# Patient Record
Sex: Female | Born: 1960 | Race: White | Hispanic: Yes | Marital: Married | Smoking: Never smoker
Health system: Southern US, Community
[De-identification: ages and names within clinical notes are randomized; demographics above are authoritative.]

## PROBLEM LIST (undated history)

## (undated) DIAGNOSIS — I10 Essential (primary) hypertension: Secondary | ICD-10-CM

## (undated) DIAGNOSIS — Z780 Asymptomatic menopausal state: Secondary | ICD-10-CM

## (undated) HISTORY — DX: Asymptomatic menopausal state: Z78.0

## (undated) HISTORY — DX: Essential (primary) hypertension: I10

---

## 2000-12-15 ENCOUNTER — Other Ambulatory Visit: Admission: RE | Admit: 2000-12-15 | Discharge: 2000-12-15 | Payer: Self-pay | Admitting: Obstetrics and Gynecology

## 2000-12-18 ENCOUNTER — Encounter: Payer: Self-pay | Admitting: Obstetrics and Gynecology

## 2000-12-18 ENCOUNTER — Ambulatory Visit (HOSPITAL_COMMUNITY): Admission: RE | Admit: 2000-12-18 | Discharge: 2000-12-18 | Payer: Self-pay | Admitting: Obstetrics and Gynecology

## 2003-06-15 ENCOUNTER — Ambulatory Visit (HOSPITAL_COMMUNITY): Admission: RE | Admit: 2003-06-15 | Discharge: 2003-06-15 | Payer: Self-pay | Admitting: Obstetrics and Gynecology

## 2004-08-22 ENCOUNTER — Ambulatory Visit (HOSPITAL_COMMUNITY): Admission: RE | Admit: 2004-08-22 | Discharge: 2004-08-22 | Payer: Self-pay | Admitting: Obstetrics and Gynecology

## 2005-08-26 ENCOUNTER — Ambulatory Visit (HOSPITAL_COMMUNITY): Admission: RE | Admit: 2005-08-26 | Discharge: 2005-08-26 | Payer: Self-pay | Admitting: Obstetrics and Gynecology

## 2006-08-29 ENCOUNTER — Ambulatory Visit (HOSPITAL_COMMUNITY): Admission: RE | Admit: 2006-08-29 | Discharge: 2006-08-29 | Payer: Self-pay | Admitting: Obstetrics and Gynecology

## 2007-09-03 ENCOUNTER — Ambulatory Visit (HOSPITAL_COMMUNITY): Admission: RE | Admit: 2007-09-03 | Discharge: 2007-09-03 | Payer: Self-pay | Admitting: Obstetrics and Gynecology

## 2007-09-23 ENCOUNTER — Other Ambulatory Visit: Admission: RE | Admit: 2007-09-23 | Discharge: 2007-09-23 | Payer: Self-pay | Admitting: Obstetrics and Gynecology

## 2008-09-07 ENCOUNTER — Ambulatory Visit (HOSPITAL_COMMUNITY): Admission: RE | Admit: 2008-09-07 | Discharge: 2008-09-07 | Payer: Self-pay | Admitting: Obstetrics and Gynecology

## 2008-10-20 ENCOUNTER — Other Ambulatory Visit: Admission: RE | Admit: 2008-10-20 | Discharge: 2008-10-20 | Payer: Self-pay | Admitting: Obstetrics and Gynecology

## 2009-09-08 ENCOUNTER — Ambulatory Visit (HOSPITAL_COMMUNITY): Admission: RE | Admit: 2009-09-08 | Discharge: 2009-09-08 | Payer: Self-pay | Admitting: Obstetrics and Gynecology

## 2009-10-23 ENCOUNTER — Other Ambulatory Visit: Admission: RE | Admit: 2009-10-23 | Discharge: 2009-10-23 | Payer: Self-pay | Admitting: Obstetrics and Gynecology

## 2010-08-29 ENCOUNTER — Other Ambulatory Visit: Payer: Self-pay | Admitting: Obstetrics and Gynecology

## 2010-08-29 DIAGNOSIS — Z139 Encounter for screening, unspecified: Secondary | ICD-10-CM

## 2010-09-13 ENCOUNTER — Ambulatory Visit (HOSPITAL_COMMUNITY)
Admission: RE | Admit: 2010-09-13 | Discharge: 2010-09-13 | Disposition: A | Discharge status: Home / Self Care | Payer: BC Managed Care – PPO | Source: Ambulatory Visit | Attending: Obstetrics and Gynecology | Admitting: Obstetrics and Gynecology

## 2010-09-13 DIAGNOSIS — Z139 Encounter for screening, unspecified: Secondary | ICD-10-CM

## 2010-09-13 DIAGNOSIS — Z1231 Encounter for screening mammogram for malignant neoplasm of breast: Secondary | ICD-10-CM | POA: Insufficient documentation

## 2010-09-18 ENCOUNTER — Other Ambulatory Visit: Payer: Self-pay | Admitting: Obstetrics and Gynecology

## 2010-09-18 DIAGNOSIS — R928 Other abnormal and inconclusive findings on diagnostic imaging of breast: Secondary | ICD-10-CM

## 2010-10-03 ENCOUNTER — Ambulatory Visit (HOSPITAL_COMMUNITY)
Admission: RE | Admit: 2010-10-03 | Discharge: 2010-10-03 | Disposition: A | Discharge status: Home / Self Care | Payer: BC Managed Care – PPO | Source: Ambulatory Visit | Attending: Obstetrics and Gynecology | Admitting: Obstetrics and Gynecology

## 2010-10-03 ENCOUNTER — Other Ambulatory Visit (HOSPITAL_COMMUNITY): Payer: Self-pay | Admitting: Obstetrics and Gynecology

## 2010-10-03 DIAGNOSIS — R928 Other abnormal and inconclusive findings on diagnostic imaging of breast: Secondary | ICD-10-CM

## 2010-10-03 DIAGNOSIS — N6009 Solitary cyst of unspecified breast: Secondary | ICD-10-CM | POA: Insufficient documentation

## 2010-11-12 ENCOUNTER — Other Ambulatory Visit: Payer: Self-pay | Admitting: Adult Health

## 2010-11-12 ENCOUNTER — Other Ambulatory Visit (HOSPITAL_COMMUNITY)
Admission: RE | Admit: 2010-11-12 | Discharge: 2010-11-12 | Disposition: A | Discharge status: Home / Self Care | Payer: BC Managed Care – PPO | Source: Ambulatory Visit | Attending: Obstetrics and Gynecology | Admitting: Obstetrics and Gynecology

## 2010-11-12 DIAGNOSIS — Z01419 Encounter for gynecological examination (general) (routine) without abnormal findings: Secondary | ICD-10-CM | POA: Insufficient documentation

## 2011-09-25 ENCOUNTER — Other Ambulatory Visit: Payer: Self-pay | Admitting: Adult Health

## 2011-09-25 DIAGNOSIS — Z139 Encounter for screening, unspecified: Secondary | ICD-10-CM

## 2011-10-07 ENCOUNTER — Ambulatory Visit (HOSPITAL_COMMUNITY)
Admission: RE | Admit: 2011-10-07 | Discharge: 2011-10-07 | Disposition: A | Discharge status: Home / Self Care | Payer: BC Managed Care – PPO | Source: Ambulatory Visit | Attending: Adult Health | Admitting: Adult Health

## 2011-10-07 DIAGNOSIS — Z1231 Encounter for screening mammogram for malignant neoplasm of breast: Secondary | ICD-10-CM | POA: Insufficient documentation

## 2011-10-07 DIAGNOSIS — Z139 Encounter for screening, unspecified: Secondary | ICD-10-CM

## 2011-11-14 ENCOUNTER — Other Ambulatory Visit (HOSPITAL_COMMUNITY)
Admission: RE | Admit: 2011-11-14 | Discharge: 2011-11-14 | Disposition: A | Discharge status: Home / Self Care | Payer: BC Managed Care – PPO | Source: Ambulatory Visit | Attending: Obstetrics and Gynecology | Admitting: Obstetrics and Gynecology

## 2011-11-14 ENCOUNTER — Other Ambulatory Visit: Payer: Self-pay | Admitting: Adult Health

## 2011-11-14 DIAGNOSIS — Z1151 Encounter for screening for human papillomavirus (HPV): Secondary | ICD-10-CM | POA: Insufficient documentation

## 2011-11-14 DIAGNOSIS — Z01419 Encounter for gynecological examination (general) (routine) without abnormal findings: Secondary | ICD-10-CM | POA: Insufficient documentation

## 2012-08-25 ENCOUNTER — Other Ambulatory Visit: Payer: Self-pay | Admitting: Adult Health

## 2012-08-25 DIAGNOSIS — Z139 Encounter for screening, unspecified: Secondary | ICD-10-CM

## 2012-10-08 ENCOUNTER — Ambulatory Visit (HOSPITAL_COMMUNITY)
Admission: RE | Admit: 2012-10-08 | Discharge: 2012-10-08 | Disposition: A | Discharge status: Home / Self Care | Payer: BC Managed Care – PPO | Source: Ambulatory Visit | Attending: Adult Health | Admitting: Adult Health

## 2012-10-08 DIAGNOSIS — Z1231 Encounter for screening mammogram for malignant neoplasm of breast: Secondary | ICD-10-CM | POA: Insufficient documentation

## 2012-10-08 DIAGNOSIS — Z139 Encounter for screening, unspecified: Secondary | ICD-10-CM

## 2012-11-18 ENCOUNTER — Encounter: Payer: Self-pay | Admitting: Adult Health

## 2012-11-18 ENCOUNTER — Ambulatory Visit (INDEPENDENT_AMBULATORY_CARE_PROVIDER_SITE_OTHER): Payer: BC Managed Care – PPO | Admitting: Adult Health

## 2012-11-18 VITALS — BP 140/88 | HR 78 | Ht 64.0 in | Wt 180.0 lb

## 2012-11-18 DIAGNOSIS — I10 Essential (primary) hypertension: Secondary | ICD-10-CM | POA: Insufficient documentation

## 2012-11-18 DIAGNOSIS — Z01419 Encounter for gynecological examination (general) (routine) without abnormal findings: Secondary | ICD-10-CM

## 2012-11-18 DIAGNOSIS — Z309 Encounter for contraceptive management, unspecified: Secondary | ICD-10-CM

## 2012-11-18 DIAGNOSIS — Z1212 Encounter for screening for malignant neoplasm of rectum: Secondary | ICD-10-CM

## 2012-11-18 LAB — HEMOCCULT GUIAC POC 1CARD (OFFICE)

## 2012-11-18 MED ORDER — HYDROCHLOROTHIAZIDE 12.5 MG PO CAPS: 12.5000 mg | ORAL_CAPSULE | Freq: Every day | ORAL | Status: DC

## 2012-11-18 NOTE — Progress Notes (Signed)
Patient ID: Kristen Hunt, female   DOB: 02-Jul-1960, 52 y.o.   MRN: [id number] History of Present Illness: Kristen Hunt is a 52 year old Hispanic female married in for her physical. She had normal pap and negative HPV in 2013.  Current Medications, Allergies, Past Medical History, Past Surgical History, Family History and Social History were reviewed in [employer] record.     Review of Systems: Patient denies any daily headaches, blurred vision, shortness of breath, chest pain, abdominal pain, problems with bowel movements, urination, or intercourse. Has menstrual headaches some, no joint pain or mood changes, has some sleep problems, no hot flashes yet.Has noticed weight gain.    Physical Exam:BP 140/88  Pulse 78  Ht 5\' 4"  (1.626 m)  Wt 180 lb (81.647 kg)  BMI 30.88 kg/m2  LMP 08/21/2014BP at home 118/78 General:  Well developed, well nourished, no acute distress Skin:  Warm and dry Neck:  Midline trachea, normal thyroid Lungs; Clear to auscultation bilaterally Breast:  No dominant palpable mass, retraction, or nipple discharge Cardiovascular: Regular rate and rhythm Abdomen:  Soft, non tender, no hepatosplenomegaly Pelvic:  External genitalia is normal in appearance.  The vagina is normal in appearance. The cervix is bulbous.  Uterus is felt to be normal size, shape, and contour.  No  adnexal masses or tenderness noted. Rectal: Good sphincter tone, no polyps, or hemorrhoids felt.  Hemoccult negative. Extremities:  No swelling or varicosities noted Psych:  Alert and cooperative seems happy   Impression: Yearly gyn exam no pap Hypertension Contraceptive management     Plan: Finish this pack of pills, camrese, and stay off x 1 month then check FSH Refilled Microzide Physical in 1 year Mammogram yearly Colonoscopy advised Will check fasting labs at [facility] check

## 2012-11-18 NOTE — Patient Instructions (Addendum)
Physical in 1 year Mammogram yearly Labs near future call when off pills x 1 month for labs Colonoscopy advised Menopausia (Menopause) La menopausia es el momento normal de la vida en que los perodos menstruales cesan completamente. Se considera definitiva cuando no ha habido perodos durante 12 meses consecutivos. Generalmente ocurre [employer] [address] y los [address], y el promedio son los 51 aos. En muy raras ocasiones se produce antes de los 40 aos. En [employer], los ovarios dejan de producir hormonas femeninas, estrgenos y Education officer, museum. Esto puede causar sntomas indeseables y Engineer, maintenance. En algunos casos los sntomas pueden ocurrir entre 4 a 5 aos antes del comienzo de la menopausia. No hay relacin entre el uso de anticonceptivos orales, el nmero de hijos que ha tenido, la raza o la edad en que los perodos menstruales comenzaron [place]). Las mujeres muy fumadoras y las muy delgadas pueden desarrollarla precozmente. CAUSAS  Los ovarios dejan de producir hormonas femeninas, estrgenos y Education officer, museum.  Otras causas son:  [place] en la que se extirpan ambos ovarios.  Los ovarios dejan de funcionar por causa desconocida.  Tumores en la glndula pituitaria, ubicada en el cerebro.  Enfermedades que [employer] ovarios y la produccin de hormonas.  Tratamiento de radioterapia en el abdomen o en la pelvis.  Quimioterapia que [employer]. SNTOMAS  Sofocos.  Sudoracin nocturna.  Disminucin del impulso sexual.  Sequedad vaginal y disminucin del tamao de los rganos genitales, lo que causa relaciones sexuales dolorosas.  Sequedad de la piel y aparicin de [place].  [place]efaleas.  Cansancio.  Irritabilidad.  Problemas de memoria.  Aumento de [place].  Infecciones urinarias.  Crecimiento del vello en el rostro y [place].  Infertilidad. Otros sintomas ms graves son:  Prdida de masa sea (osteoporosis), lo que causa fracturas de  huesos.  Depresin.  Endurecimiento y Scientist, research (medical) de las arterias (aterosclerosis), lo que puede ocasionar infartos e ictus. DIAGNSTICO  Cuando el perodo menstrual falta durante 12 meses corridos.  Exmenes fsicos  Estudios hormonales de [place]. TRATAMIENTO Hay muchas opciones de tratamiento y casi tantas preguntas como opciones existen. La decisin de tratar o no los cambios que trae la menopausia es una decisin que realiza el profesional de acuerdo con cada Marketing executive. El mdico comentar el tratamiento con usted. Juntos pueden decidir que tratamiento ser el mejor, por ejemplo:  Tratamiento de reemplazo hormonal.  Tratamiento de los sntomas individuales con medicamentos (por ejemplo tranquilizantes para la depresin).  Hierbas que pueden ayudar en algunos sntomas especficos.  Psicoterapia con un psiquiatra o un psiclogo.  Terapia grupal.  No recibir tratamiento. INSTRUCCIONES PARA EL CUIDADO DOMICILIARIO  Tome los medicamentos segn las indicaciones.  Descanse y duerma lo suficiente.  Practicar ejercicios con regularidad.  Consuma una dieta rica en calcio (buena para los [place]) y soja (acta como un estrgeno).  Evite las bebidas alcohlicas.  No fume.  El consumo de vitamina E puede ayudar en ciertos casos.  Si tiene sofocos, vstase en capas.  Tome suplementos de calcio y vitamina D para fortalecer los [place].  Puede usar cremas de venta libre para la sequedad vaginal.  En algunos casos es de gran ayuda la terapia grupal.  Tambin puede ser de utilidad la acupuntura. SOLICITE ANTENCIN MDICA SI:  No est segura de estar cursando la menopausia.  Tiene los sntomas y necesita consejo y [place].  Tiene perodos menstruales despus de los 55 aos.  Tiene dolor durante las [employer].  Est en la menopausia (no  ha tenido perodos menstruales durante 12 meses) y desarrolla una hemorragia vaginal.  Necesita ser derivada  a un especialista (gineclogo, psiquiatra o psiclogo) para Pensions consultant. SOLICITE ATENCIN MDICA DE INMEDIATO SI:  Sufre una depresin severa.  Tiene una hemorragia vaginal abundante.  Se ha cado y piensa que se ha roto un hueso.  Siente dolor al [employer].  Siente dolor en el pecho o en la pierna.  Siente latidos cardacos acelerados (palpitaciones)  Tiene dolores de cabeza intensos.  Desarrolla trastornos visuales.  Siente un bulto en el pecho.  Tiene dolor abdominal, o sufre una indigestin grave. Document Released: 04/01/2006 Document Revised: 05/13/2011 [facility] Patient Information 2014 [place], [place].

## 2013-04-29 ENCOUNTER — Other Ambulatory Visit: Payer: BC Managed Care – PPO

## 2013-05-03 ENCOUNTER — Other Ambulatory Visit: Payer: BC Managed Care – PPO

## 2013-05-03 ENCOUNTER — Encounter (INDEPENDENT_AMBULATORY_CARE_PROVIDER_SITE_OTHER): Payer: Self-pay

## 2013-05-03 DIAGNOSIS — Z1329 Encounter for screening for other suspected endocrine disorder: Secondary | ICD-10-CM

## 2013-05-03 DIAGNOSIS — Z0000 Encounter for general adult medical examination without abnormal findings: Secondary | ICD-10-CM

## 2013-05-03 DIAGNOSIS — Z13220 Encounter for screening for lipoid disorders: Secondary | ICD-10-CM

## 2013-05-03 LAB — COMPREHENSIVE METABOLIC PANEL
ALT: 54 U/L — ABNORMAL HIGH (ref 0–35)
AST: 33 U/L (ref 0–37)
Albumin: 4 g/dL (ref 3.5–5.2)
Alkaline Phosphatase: 105 U/L (ref 39–117)
BUN: 12 mg/dL (ref 6–23)
CALCIUM: 9.3 mg/dL (ref 8.4–10.5)
CHLORIDE: 102 meq/L (ref 96–112)
CO2: 30 mEq/L (ref 19–32)
CREATININE: 0.79 mg/dL (ref 0.50–1.10)
Glucose, Bld: 95 mg/dL (ref 70–99)
Potassium: 4.5 mEq/L (ref 3.5–5.3)
Sodium: 139 mEq/L (ref 135–145)
Total Bilirubin: 0.7 mg/dL (ref 0.2–1.2)
Total Protein: 6.6 g/dL (ref 6.0–8.3)

## 2013-05-03 LAB — LIPID PANEL
CHOL/HDL RATIO: 3.3 ratio
Cholesterol: 186 mg/dL (ref 0–200)
HDL: 56 mg/dL (ref >39)
LDL Cholesterol: 112 mg/dL — ABNORMAL HIGH (ref 0–99)
TRIGLYCERIDES: 91 mg/dL (ref <150)
VLDL: 18 mg/dL (ref 0–40)

## 2013-05-03 LAB — CBC
HEMATOCRIT: 40.6 % (ref 36.0–46.0)
Hemoglobin: 13.7 g/dL (ref 12.0–15.0)
MCH: 28.8 pg (ref 26.0–34.0)
MCHC: 33.7 g/dL (ref 30.0–36.0)
MCV: 85.5 fL (ref 78.0–100.0)
Platelets: 322 10*3/uL (ref 150–400)
RBC: 4.75 MIL/uL (ref 3.87–5.11)
RDW: 14.6 % (ref 11.5–15.5)
WBC: 7.2 10*3/uL (ref 4.0–10.5)

## 2013-05-03 LAB — TSH: TSH: 1.322 u[IU]/mL (ref 0.350–4.500)

## 2013-05-04 LAB — FOLLICLE STIMULATING HORMONE: FSH: 98 m[IU]/mL

## 2013-05-05 ENCOUNTER — Telehealth: Payer: Self-pay | Admitting: Obstetrics and Gynecology

## 2013-05-05 NOTE — Telephone Encounter (Signed)
Pt aware of labs, FSH 98

## 2013-06-30 ENCOUNTER — Other Ambulatory Visit: Payer: Self-pay | Admitting: Obstetrics and Gynecology

## 2013-09-22 ENCOUNTER — Other Ambulatory Visit: Payer: Self-pay | Admitting: Obstetrics and Gynecology

## 2013-09-22 DIAGNOSIS — Z1231 Encounter for screening mammogram for malignant neoplasm of breast: Secondary | ICD-10-CM

## 2013-10-11 ENCOUNTER — Ambulatory Visit (HOSPITAL_COMMUNITY)
Admission: RE | Admit: 2013-10-11 | Discharge: 2013-10-11 | Disposition: A | Discharge status: Home / Self Care | Payer: BC Managed Care – PPO | Source: Ambulatory Visit | Attending: Obstetrics and Gynecology | Admitting: Obstetrics and Gynecology

## 2013-10-11 DIAGNOSIS — Z1231 Encounter for screening mammogram for malignant neoplasm of breast: Secondary | ICD-10-CM

## 2013-11-19 ENCOUNTER — Encounter: Payer: Self-pay | Admitting: Adult Health

## 2013-11-19 ENCOUNTER — Ambulatory Visit (INDEPENDENT_AMBULATORY_CARE_PROVIDER_SITE_OTHER): Payer: BC Managed Care – PPO | Admitting: Adult Health

## 2013-11-19 VITALS — BP 130/84 | HR 74 | Ht 63.25 in | Wt 178.5 lb

## 2013-11-19 DIAGNOSIS — Z780 Asymptomatic menopausal state: Secondary | ICD-10-CM

## 2013-11-19 DIAGNOSIS — Z01419 Encounter for gynecological examination (general) (routine) without abnormal findings: Secondary | ICD-10-CM

## 2013-11-19 DIAGNOSIS — I10 Essential (primary) hypertension: Secondary | ICD-10-CM

## 2013-11-19 DIAGNOSIS — Z1212 Encounter for screening for malignant neoplasm of rectum: Secondary | ICD-10-CM

## 2013-11-19 HISTORY — DX: Asymptomatic menopausal state: Z78.0

## 2013-11-19 LAB — HEMOCCULT GUIAC POC 1CARD (OFFICE): Fecal Occult Blood, POC: NEGATIVE

## 2013-11-19 MED ORDER — HYDROCHLOROTHIAZIDE 12.5 MG PO CAPS: ORAL_CAPSULE | ORAL | Status: DC

## 2013-11-19 NOTE — Progress Notes (Signed)
Patient ID: Kristen Hunt, female   DOB: December 31, 1960, 53 y.o.   MRN: [id number] History of Present Illness: Kristen Hunt is a 53 year old Hispanic female, in for a gyn physical.She had a normal pap with negative HPV 11/14/11.No complaints.   Current Medications, Allergies, Past Medical History, Past Surgical History, Family History and Social History were reviewed in [employer] record.     Review of Systems: Patient denies any headaches, blurred vision, shortness of breath, chest pain, abdominal pain, problems with bowel movements, urination, or intercourse. No joint pain or mood swings.    Physical Exam:BP 130/84  Pulse 74  Ht 5' 3.25" (1.607 m)  Wt 178 lb 8 oz (80.967 kg)  BMI 31.35 kg/m2  LMP 10/22/2012 General:  Well developed, well nourished, no acute distress Skin:  Warm and dry Neck:  Midline trachea, normal thyroid Lungs; Clear to auscultation bilaterally Breast:  No dominant palpable mass, retraction, or nipple discharge Cardiovascular: Regular rate and rhythm Abdomen:  Soft, non tender, no hepatosplenomegaly Pelvic:  External genitalia is normal in appearance.  The vagina is normal in appearance.  The cervix is bulbous.  Uterus is felt to be normal size, shape, and contour.  No adnexal masses or tenderness noted. Rectal: Good sphincter tone, no polyps, or hemorrhoids felt.  Hemoccult negative. Extremities:  No swelling or varicosities noted Psych:  No mood changes, alert and cooperative,seems happy, is working 4 miles daily and is trying to cut down on food. Had labs earlier this year.   Impression: Yearly gyn exam no pap Hypertension  Postmenopausal    Plan: Refilled Microzide 12.5 mg #90 with 4 refills, take 1 daily Physical and pap in 1 year Mammogram yearly Colonoscopy advised

## 2013-11-19 NOTE — Patient Instructions (Signed)
Physical in 1 year Mammogram yearly  Colonoscopy advised  

## 2014-01-03 ENCOUNTER — Encounter: Payer: Self-pay | Admitting: Adult Health

## 2014-09-21 ENCOUNTER — Other Ambulatory Visit: Payer: Self-pay | Admitting: Obstetrics and Gynecology

## 2014-09-21 DIAGNOSIS — Z1231 Encounter for screening mammogram for malignant neoplasm of breast: Secondary | ICD-10-CM

## 2014-10-13 ENCOUNTER — Ambulatory Visit (HOSPITAL_COMMUNITY)
Admission: RE | Admit: 2014-10-13 | Discharge: 2014-10-13 | Disposition: A | Discharge status: Home / Self Care | Payer: BC Managed Care – PPO | Source: Ambulatory Visit | Attending: Obstetrics and Gynecology | Admitting: Obstetrics and Gynecology

## 2014-10-13 DIAGNOSIS — Z1231 Encounter for screening mammogram for malignant neoplasm of breast: Secondary | ICD-10-CM

## 2014-11-24 ENCOUNTER — Encounter: Payer: Self-pay | Admitting: Adult Health

## 2014-11-24 ENCOUNTER — Ambulatory Visit (INDEPENDENT_AMBULATORY_CARE_PROVIDER_SITE_OTHER): Payer: BC Managed Care – PPO | Admitting: Adult Health

## 2014-11-24 ENCOUNTER — Other Ambulatory Visit (HOSPITAL_COMMUNITY)
Admission: RE | Admit: 2014-11-24 | Discharge: 2014-11-24 | Disposition: A | Discharge status: Home / Self Care | Payer: BC Managed Care – PPO | Source: Ambulatory Visit | Attending: Adult Health | Admitting: Adult Health

## 2014-11-24 VITALS — BP 130/90 | HR 88 | Ht 64.0 in | Wt 177.0 lb

## 2014-11-24 DIAGNOSIS — Z01419 Encounter for gynecological examination (general) (routine) without abnormal findings: Secondary | ICD-10-CM | POA: Insufficient documentation

## 2014-11-24 DIAGNOSIS — Z1211 Encounter for screening for malignant neoplasm of colon: Secondary | ICD-10-CM | POA: Diagnosis not present

## 2014-11-24 DIAGNOSIS — Z780 Asymptomatic menopausal state: Secondary | ICD-10-CM

## 2014-11-24 DIAGNOSIS — Z1151 Encounter for screening for human papillomavirus (HPV): Secondary | ICD-10-CM | POA: Diagnosis present

## 2014-11-24 DIAGNOSIS — I10 Essential (primary) hypertension: Secondary | ICD-10-CM

## 2014-11-24 LAB — HEMOCCULT GUIAC POC 1CARD (OFFICE): Fecal Occult Blood, POC: NEGATIVE

## 2014-11-24 MED ORDER — HYDROCHLOROTHIAZIDE 12.5 MG PO CAPS: ORAL_CAPSULE | ORAL | Status: DC

## 2014-11-24 NOTE — Patient Instructions (Signed)
Physical in 1 year Mammogram yearly  Colonoscopy advised  

## 2014-11-24 NOTE — Progress Notes (Signed)
Patient ID: Kristen Hunt, female   DOB: 17-Feb-1961, 54 y.o.   MRN: [id number] History of Present Illness: Kristen Hunt is a 54 year old Hispanic female in for well woman gyn exam and pap.No complaints.   Current Medications, Allergies, Past Medical History, Past Surgical History, Family History and Social History were reviewed in [employer] record.     Review of Systems: Patient denies any headaches, hearing loss, fatigue, blurred vision, shortness of breath, chest pain, abdominal pain, problems with bowel movements, urination, or intercourse. No joint pain or mood swings.No bleeding for 2 years.Does to gym.    Physical Exam:BP 130/90 mmHg  Pulse 88  Ht  (1.626 m)  Wt 177 lb (80.287 kg)  BMI 30.37 kg/m2  LMP 10/22/2012 General:  Well developed, well nourished, no acute distress Skin:  Warm and dry Neck:  Midline trachea, normal thyroid, good ROM, no lymphadenopathy Lungs; Clear to auscultation bilaterally Breast:  No dominant palpable mass, retraction, or nipple discharge Cardiovascular: Regular rate and rhythm Abdomen:  Soft, non tender, no hepatosplenomegaly Pelvic:  External genitalia is normal in appearance, no lesions.  The vagina is normal in appearance. Urethra has no lesions or masses. The cervix is slightly everted at os, pap with HPV performed.Uterus is felt to be normal size, shape, and contour.  No adnexal masses or tenderness noted.Bladder is non tender, no masses felt. Rectal: Good sphincter tone, no polyps, or hemorrhoids felt.  Hemoccult negative. Extremities/musculoskeletal:  No swelling or varicosities noted, no clubbing or cyanosis Psych:  No mood changes, alert and cooperative,seems happy   Impression:  Well woman gyn exam and pap Hypertension PM   Plan: Refilled microzide 12.5 mg #90 take 1 daily with 3 refills Physical in 1 year Mammogram yearly Colonoscopy advised Labs at school

## 2014-11-28 ENCOUNTER — Other Ambulatory Visit: Payer: Self-pay | Admitting: Adult Health

## 2014-11-28 LAB — CYTOLOGY - PAP

## 2015-09-26 ENCOUNTER — Other Ambulatory Visit: Payer: Self-pay | Admitting: Adult Health

## 2015-09-26 DIAGNOSIS — Z1231 Encounter for screening mammogram for malignant neoplasm of breast: Secondary | ICD-10-CM

## 2015-10-16 ENCOUNTER — Ambulatory Visit (HOSPITAL_COMMUNITY)
Admission: RE | Admit: 2015-10-16 | Discharge: 2015-10-16 | Disposition: A | Discharge status: Home / Self Care | Payer: BC Managed Care – PPO | Source: Ambulatory Visit | Attending: Adult Health | Admitting: Adult Health

## 2015-10-16 DIAGNOSIS — Z1231 Encounter for screening mammogram for malignant neoplasm of breast: Secondary | ICD-10-CM | POA: Insufficient documentation

## 2015-12-04 ENCOUNTER — Encounter: Payer: Self-pay | Admitting: Adult Health

## 2015-12-04 ENCOUNTER — Ambulatory Visit (INDEPENDENT_AMBULATORY_CARE_PROVIDER_SITE_OTHER): Payer: BC Managed Care – PPO | Admitting: Adult Health

## 2015-12-04 VITALS — BP 128/80 | HR 68 | Ht 63.25 in | Wt 182.5 lb

## 2015-12-04 DIAGNOSIS — Z01411 Encounter for gynecological examination (general) (routine) with abnormal findings: Secondary | ICD-10-CM | POA: Diagnosis not present

## 2015-12-04 DIAGNOSIS — I10 Essential (primary) hypertension: Secondary | ICD-10-CM | POA: Diagnosis not present

## 2015-12-04 DIAGNOSIS — Z01419 Encounter for gynecological examination (general) (routine) without abnormal findings: Secondary | ICD-10-CM

## 2015-12-04 DIAGNOSIS — Z1212 Encounter for screening for malignant neoplasm of rectum: Secondary | ICD-10-CM

## 2015-12-04 DIAGNOSIS — Z780 Asymptomatic menopausal state: Secondary | ICD-10-CM | POA: Diagnosis not present

## 2015-12-04 LAB — HEMOCCULT GUIAC POC 1CARD (OFFICE): Fecal Occult Blood, POC: NEGATIVE

## 2015-12-04 NOTE — Progress Notes (Signed)
Patient ID: Kristen RowanMaria Raether, female   DOB: 10-14-1960, 55 y.o.   MRN: [id number] History of Present Illness: Byrd HesselbachMaria is a 55 year old Hispanic female in for well woman gyn exam, she had a normal pap with negative HPV 11/24/14.  PCP is Dr Gerda Dissluking.   Current Medications, Allergies, Past Medical History, Past Surgical History, Family History and Social History were reviewed in [facility] electronic [facility] record.     Review of Systems: Patient denies any headaches, hearing loss, fatigue, blurred vision, shortness of breath, chest pain, abdominal pain, problems with bowel movements, urination, or intercourse. No joint pain or mood swings.    Physical Exam:BP 128/80 (BP Location: Left Arm, Patient Position: Sitting, Cuff Size: Normal)   Pulse 68   Ht 5' 3.25" (1.607 m)   Wt 182 lb 8 oz (82.8 kg)   LMP 10/22/2012   BMI 32.07 kg/m  General:  Well developed, well nourished, no acute distress Skin:  Warm and dry Neck:  Midline trachea, normal thyroid, good ROM, no lymphadenopathy Lungs; Clear to auscultation bilaterally Breast:  No dominant palpable mass, retraction, or nipple discharge Cardiovascular: Regular rate and rhythm Abdomen:  Soft, non tender, no hepatosplenomegaly Pelvic:  External genitalia is normal in appearance, no lesions.  The vagina is normal in appearance. Urethra has no lesions or masses. The cervix is bulbous.  Uterus is felt to be normal size, shape, and contour.  No adnexal masses or tenderness noted.Bladder is non tender, no masses felt. Rectal: Good sphincter tone, no polyps, or hemorrhoids felt.  Hemoccult negative. Extremities/musculoskeletal:  No swelling or varicosities noted, no clubbing or cyanosis Psych:  No mood changes, alert and cooperative,seems happy PHQ 2 score 0  Impression: 1. Encounter for well woman exam with routine gynecological exam   2. Essential hypertension   3. Postmenopausal       Plan: Physical in 1 year, pap 2019 Mammogram  yearly Labs fasting near future after trip to [place]  Needs colonoscopy she will call for referral when back from trip

## 2015-12-04 NOTE — Patient Instructions (Signed)
Physical in 1 year, pap 2019 Mammogram yearly Labs fasting near future  Needs colonoscopy

## 2016-02-11 ENCOUNTER — Other Ambulatory Visit: Payer: Self-pay | Admitting: Adult Health

## 2016-10-10 ENCOUNTER — Other Ambulatory Visit: Payer: Self-pay | Admitting: Adult Health

## 2016-10-10 DIAGNOSIS — Z1231 Encounter for screening mammogram for malignant neoplasm of breast: Secondary | ICD-10-CM

## 2016-10-16 ENCOUNTER — Ambulatory Visit (HOSPITAL_COMMUNITY)
Admission: RE | Admit: 2016-10-16 | Discharge: 2016-10-16 | Disposition: A | Discharge status: Home / Self Care | Payer: BC Managed Care – PPO | Source: Ambulatory Visit | Attending: Adult Health | Admitting: Adult Health

## 2016-10-16 DIAGNOSIS — Z1231 Encounter for screening mammogram for malignant neoplasm of breast: Secondary | ICD-10-CM | POA: Insufficient documentation

## 2016-12-31 ENCOUNTER — Ambulatory Visit (INDEPENDENT_AMBULATORY_CARE_PROVIDER_SITE_OTHER): Payer: BC Managed Care – PPO | Admitting: Adult Health

## 2016-12-31 ENCOUNTER — Encounter: Payer: Self-pay | Admitting: Adult Health

## 2016-12-31 ENCOUNTER — Encounter (INDEPENDENT_AMBULATORY_CARE_PROVIDER_SITE_OTHER): Payer: Self-pay

## 2016-12-31 VITALS — BP 138/84 | HR 93 | Ht 65.0 in | Wt 190.0 lb

## 2016-12-31 DIAGNOSIS — Z01411 Encounter for gynecological examination (general) (routine) with abnormal findings: Secondary | ICD-10-CM | POA: Diagnosis not present

## 2016-12-31 DIAGNOSIS — Z01419 Encounter for gynecological examination (general) (routine) without abnormal findings: Secondary | ICD-10-CM | POA: Insufficient documentation

## 2016-12-31 DIAGNOSIS — Z1212 Encounter for screening for malignant neoplasm of rectum: Secondary | ICD-10-CM | POA: Diagnosis not present

## 2016-12-31 DIAGNOSIS — Z1211 Encounter for screening for malignant neoplasm of colon: Secondary | ICD-10-CM | POA: Diagnosis not present

## 2016-12-31 DIAGNOSIS — I10 Essential (primary) hypertension: Secondary | ICD-10-CM | POA: Diagnosis not present

## 2016-12-31 DIAGNOSIS — Z780 Asymptomatic menopausal state: Secondary | ICD-10-CM | POA: Diagnosis not present

## 2016-12-31 LAB — HEMOCCULT GUIAC POC 1CARD (OFFICE): Fecal Occult Blood, POC: NEGATIVE

## 2016-12-31 MED ORDER — HYDROCHLOROTHIAZIDE 12.5 MG PO CAPS: 12.5000 mg | ORAL_CAPSULE | Freq: Every day | ORAL | 3 refills | Status: DC

## 2016-12-31 NOTE — Patient Instructions (Signed)
Pap and physical in 1 year  

## 2016-12-31 NOTE — Progress Notes (Signed)
Patient ID: Kristen RowanMaria Hunt, female   DOB: [birth year], 56 y.o.   MRN: [id number] History of Present Illness: Kristen Hunt is a 56 year old Hispanic female, married,PM in for a well woman gyn exam, she had a normal pap with negative HPV 11/24/14.She went to gym this morning before appointment for a hour and she still works.   Current Medications, Allergies, Past Medical History, Past Surgical History, Family History and Social History were reviewed in [facility] electronic medical record.     Review of Systems:  Patient denies any headaches, hearing loss, fatigue, blurred vision, shortness of breath, chest pain, abdominal pain, problems with bowel movements, urination, or intercourse. No joint pain or mood swings.   Physical Exam:BP 138/84 (BP Location: Right Arm, Patient Position: Sitting, Cuff Size: Normal)   Pulse 93   Ht 5\' 5"  (1.651 m)   Wt 190 lb (86.2 kg)   LMP 10/22/2012   BMI 31.62 kg/m  General:  Well developed, well nourished, no acute distress Skin:  Warm and dry Neck:  Midline trachea, normal thyroid, good ROM, no lymphadenopathy Lungs; Clear to auscultation bilaterally Breast:  No dominant palpable mass, retraction, or nipple discharge Cardiovascular: Regular rate and rhythm Abdomen:  Soft, non tender, no hepatosplenomegaly Pelvic:  External genitalia is normal in appearance, no lesions.  The vagina is normal in appearance. Urethra has no lesions or masses. The cervix is smooth.  Uterus is felt to be normal size, shape, and contour.  No adnexal masses or tenderness noted.Bladder is non tender, no masses felt. Rectal: Good sphincter tone, no polyps, or hemorrhoids felt.  Hemoccult negative. Extremities/musculoskeletal:  No swelling or varicosities noted, no clubbing or cyanosis Psych:  No mood changes, alert and cooperative,seems happy PHQ 2 score 0.   Impression: 1. Encounter for well woman exam with routine gynecological exam   2. Screening for colorectal cancer   3.  Essential hypertension   4. Postmenopausal       Plan: Meds ordered this encounter  Medications  . hydrochlorothiazide (MICROZIDE) 12.5 MG capsule    Sig: Take 1 capsule (12.5 mg total) by mouth daily.    Dispense:  90 capsule    Refill:  3    Order Specific Question:   Supervising Provider    Answer:   Duane LopeEURE, LUTHER H [[id number]]  Check CBC,CMP,TSH and lipids,HIV and Hepatitis C antibody Pap and physical in 1 year Mammogram yearly Colonoscopy advised

## 2017-01-17 ENCOUNTER — Telehealth: Payer: Self-pay | Admitting: Adult Health

## 2017-01-17 LAB — COMPREHENSIVE METABOLIC PANEL
ALBUMIN: 4.5 g/dL (ref 3.5–5.5)
ALK PHOS: 131 IU/L — AB (ref 39–117)
ALT: 28 IU/L (ref 0–32)
AST: 22 IU/L (ref 0–40)
Albumin/Globulin Ratio: 1.7 (ref 1.2–2.2)
BUN / CREAT RATIO: 21 (ref 9–23)
BUN: 15 mg/dL (ref 6–24)
Bilirubin Total: 0.5 mg/dL (ref 0.0–1.2)
CALCIUM: 9.5 mg/dL (ref 8.7–10.2)
CO2: 21 mmol/L (ref 20–29)
CREATININE: 0.72 mg/dL (ref 0.57–1.00)
Chloride: 101 mmol/L (ref 96–106)
GFR, EST AFRICAN AMERICAN: 108 mL/min/{1.73_m2} (ref >59)
GFR, EST NON AFRICAN AMERICAN: 94 mL/min/{1.73_m2} (ref >59)
GLUCOSE: 102 mg/dL — AB (ref 65–99)
Globulin, Total: 2.7 g/dL (ref 1.5–4.5)
Potassium: 4.5 mmol/L (ref 3.5–5.2)
Sodium: 141 mmol/L (ref 134–144)
TOTAL PROTEIN: 7.2 g/dL (ref 6.0–8.5)

## 2017-01-17 LAB — CBC
HEMOGLOBIN: 12.9 g/dL (ref 11.1–15.9)
Hematocrit: 38.6 % (ref 34.0–46.6)
MCH: 29.3 pg (ref 26.6–33.0)
MCHC: 33.4 g/dL (ref 31.5–35.7)
MCV: 88 fL (ref 79–97)
Platelets: 327 10*3/uL (ref 150–379)
RBC: 4.4 x10E6/uL (ref 3.77–5.28)
RDW: 13.6 % (ref 12.3–15.4)
WBC: 7.2 10*3/uL (ref 3.4–10.8)

## 2017-01-17 LAB — LIPID PANEL
Chol/HDL Ratio: 3.7 ratio (ref 0.0–4.4)
Cholesterol, Total: 202 mg/dL — ABNORMAL HIGH (ref 100–199)
HDL: 54 mg/dL (ref >39)
LDL Calculated: 126 mg/dL — ABNORMAL HIGH (ref 0–99)
Triglycerides: 109 mg/dL (ref 0–149)
VLDL CHOLESTEROL CAL: 22 mg/dL (ref 5–40)

## 2017-01-17 LAB — HIV ANTIBODY (ROUTINE TESTING W REFLEX): HIV SCREEN 4TH GENERATION: NONREACTIVE

## 2017-01-17 LAB — HEPATITIS C ANTIBODY: Hep C Virus Ab: <0.1 s/co ratio (ref 0.0–0.9)

## 2017-01-17 LAB — TSH: TSH: 1.36 u[IU]/mL (ref 0.450–4.500)

## 2017-01-17 NOTE — Telephone Encounter (Signed)
Pt aware labs look good 

## 2017-10-14 ENCOUNTER — Other Ambulatory Visit: Payer: Self-pay | Admitting: Obstetrics and Gynecology

## 2017-10-14 DIAGNOSIS — Z1231 Encounter for screening mammogram for malignant neoplasm of breast: Secondary | ICD-10-CM

## 2017-10-22 ENCOUNTER — Ambulatory Visit (HOSPITAL_COMMUNITY)
Admission: RE | Admit: 2017-10-22 | Discharge: 2017-10-22 | Disposition: A | Discharge status: Home / Self Care | Payer: BC Managed Care – PPO | Source: Ambulatory Visit | Attending: Obstetrics and Gynecology | Admitting: Obstetrics and Gynecology

## 2017-10-22 DIAGNOSIS — Z1231 Encounter for screening mammogram for malignant neoplasm of breast: Secondary | ICD-10-CM | POA: Diagnosis present

## 2018-01-29 ENCOUNTER — Other Ambulatory Visit: Payer: Self-pay | Admitting: Adult Health

## 2018-10-05 ENCOUNTER — Other Ambulatory Visit (HOSPITAL_COMMUNITY): Payer: Self-pay | Admitting: Obstetrics and Gynecology

## 2018-10-05 DIAGNOSIS — Z1231 Encounter for screening mammogram for malignant neoplasm of breast: Secondary | ICD-10-CM

## 2018-10-29 ENCOUNTER — Other Ambulatory Visit: Payer: Self-pay

## 2018-10-29 ENCOUNTER — Ambulatory Visit (HOSPITAL_COMMUNITY)
Admission: RE | Admit: 2018-10-29 | Discharge: 2018-10-29 | Disposition: A | Discharge status: Home / Self Care | Payer: BC Managed Care – PPO | Source: Ambulatory Visit | Attending: Obstetrics and Gynecology | Admitting: Obstetrics and Gynecology

## 2018-10-29 DIAGNOSIS — Z1231 Encounter for screening mammogram for malignant neoplasm of breast: Secondary | ICD-10-CM | POA: Insufficient documentation

## 2019-02-05 ENCOUNTER — Telehealth: Payer: Self-pay | Admitting: Adult Health

## 2019-02-05 NOTE — Telephone Encounter (Signed)

## 2019-02-08 ENCOUNTER — Other Ambulatory Visit: Payer: BC Managed Care – PPO | Admitting: Adult Health

## 2019-02-23 ENCOUNTER — Other Ambulatory Visit: Payer: Self-pay | Admitting: Adult Health

## 2019-05-15 ENCOUNTER — Ambulatory Visit: Payer: BC Managed Care – PPO | Attending: Internal Medicine

## 2019-05-15 DIAGNOSIS — Z23 Encounter for immunization: Secondary | ICD-10-CM

## 2019-05-15 NOTE — Progress Notes (Signed)
   Covid-19 Vaccination Clinic  Name:  Kristen Hunt    MRN: [id number] DOB: 27-Dec-1960  05/15/2019  Kristen Hunt was observed post Covid-19 immunization for 15 minutes without incident. She was provided with Vaccine Information Sheet and instruction to access the V-Safe system.   Kristen Hunt was instructed to call 911 with any severe reactions post vaccine: Marland Kitchen Difficulty breathing  . Swelling of face and throat  . A fast heartbeat  . A bad rash all over body  . Dizziness and weakness   Immunizations Administered    Name Date Dose VIS Date Route   Moderna COVID-19 Vaccine 05/15/2019 10:47 AM 0.5 mL 02/02/2019 Intramuscular   Manufacturer: Moderna   Lot: [id number]   NDC: 37543-606-77

## 2019-06-16 ENCOUNTER — Ambulatory Visit: Payer: BC Managed Care – PPO | Attending: Internal Medicine

## 2019-06-16 DIAGNOSIS — Z23 Encounter for immunization: Secondary | ICD-10-CM

## 2019-06-16 NOTE — Progress Notes (Signed)
   Covid-19 Vaccination Clinic  Name:  Kristen Hunt    MRN: [id number] DOB: 1960/09/01  06/16/2019  [name] was observed post Covid-19 immunization for 15 minutes without incident. She was provided with Vaccine Information Sheet and instruction to access the V-Safe system.   [name] was instructed to call 911 with any severe reactions post vaccine: Marland Kitchen Difficulty breathing  . Swelling of face and throat  . A fast heartbeat  . A bad rash all over body  . Dizziness and weakness   Immunizations Administered    Name Date Dose VIS Date Route   Moderna COVID-19 Vaccine 06/16/2019 10:07 AM 0.5 mL 02/02/2019 Intramuscular   Manufacturer: Moderna   Lot: [id number]   NDC: 75170-017-49

## 2019-06-18 ENCOUNTER — Telehealth: Payer: Self-pay | Admitting: Adult Health

## 2019-06-18 NOTE — Telephone Encounter (Signed)

## 2019-06-21 ENCOUNTER — Other Ambulatory Visit (HOSPITAL_COMMUNITY)
Admission: RE | Admit: 2019-06-21 | Discharge: 2019-06-21 | Disposition: A | Discharge status: Home / Self Care | Payer: BC Managed Care – PPO | Source: Ambulatory Visit | Attending: Adult Health | Admitting: Adult Health

## 2019-06-21 ENCOUNTER — Ambulatory Visit (INDEPENDENT_AMBULATORY_CARE_PROVIDER_SITE_OTHER): Payer: BC Managed Care – PPO | Admitting: Adult Health

## 2019-06-21 ENCOUNTER — Other Ambulatory Visit: Payer: Self-pay

## 2019-06-21 ENCOUNTER — Encounter: Payer: Self-pay | Admitting: Adult Health

## 2019-06-21 VITALS — BP 141/85 | HR 86 | Ht 63.0 in | Wt 186.0 lb

## 2019-06-21 DIAGNOSIS — I10 Essential (primary) hypertension: Secondary | ICD-10-CM

## 2019-06-21 DIAGNOSIS — K649 Unspecified hemorrhoids: Secondary | ICD-10-CM | POA: Diagnosis not present

## 2019-06-21 DIAGNOSIS — Z1212 Encounter for screening for malignant neoplasm of rectum: Secondary | ICD-10-CM

## 2019-06-21 DIAGNOSIS — Z1211 Encounter for screening for malignant neoplasm of colon: Secondary | ICD-10-CM | POA: Insufficient documentation

## 2019-06-21 DIAGNOSIS — Z780 Asymptomatic menopausal state: Secondary | ICD-10-CM

## 2019-06-21 DIAGNOSIS — Z1272 Encounter for screening for malignant neoplasm of vagina: Secondary | ICD-10-CM

## 2019-06-21 DIAGNOSIS — Z01419 Encounter for gynecological examination (general) (routine) without abnormal findings: Secondary | ICD-10-CM | POA: Insufficient documentation

## 2019-06-21 LAB — HEMOCCULT GUIAC POC 1CARD (OFFICE): Fecal Occult Blood, POC: NEGATIVE

## 2019-06-21 MED ORDER — HYDROCHLOROTHIAZIDE 12.5 MG PO CAPS: ORAL_CAPSULE | ORAL | 4 refills | Status: DC

## 2019-06-21 MED ORDER — HYDROCORTISONE ACE-PRAMOXINE 1-1 % EX CREA: 1.0000 "application " | TOPICAL_CREAM | Freq: Two times a day (BID) | CUTANEOUS | 0 refills | Status: DC

## 2019-06-21 NOTE — Progress Notes (Addendum)
Patient ID: Kristen Hunt, female   DOB: 1960/11/30, 59 y.o.   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   Current Medications, Allergies, Past Medical History, Past Surgical History, Family History and Social History were reviewed in [employer] record.     Review of Systems: Patient denies any headaches, hearing loss, fatigue, blurred vision, shortness of breath, chest pain, abdominal pain, problems with bowel movements, urination, or intercourse. No joint pain or mood swings. Has hemorrhoids  Denies any vaginal bleeding   Physical Exam:BP (!) 141/85 (BP Location: Left Arm, Patient Position: Sitting, Cuff Size: Normal)   Pulse 86   Ht 5\' 3"  (1.6 m)   Wt 186 lb (84.4 kg)   LMP 10/22/2012   BMI 32.95 kg/m  General:  Well developed, well nourished, no acute distress Skin:  Warm and dry Neck:  Midline trachea, normal thyroid, good ROM, no lymphadenopathy Lungs; Clear to auscultation bilaterally Breast:  No dominant palpable mass, retraction, or nipple discharge Cardiovascular: Regular rate and rhythm Abdomen:  Soft, non tender, no hepatosplenomegaly Pelvic:  External genitalia is normal in appearance, no lesions.  The vagina is normal in appearance. Urethra has no lesions or masses. The cervix is bulbous.Pap with high risk HPV 16/18 genotyping performed.  Uterus is felt to be normal size, shape, and contour.  No adnexal masses or tenderness noted.Bladder is non tender, no masses felt. Rectal: Good sphincter tone, no polyps, + hemorrhoids felt.  Hemoccult negative. Extremities/musculoskeletal:  No swelling or varicosities noted, no clubbing or cyanosis Psych:  No mood changes, alert and cooperative,seems happy AA 0 Fall risk is low PHQ 9 score is 1. Examination chaperoned by 10/24/2012 LPN.   Impression and  Plan: 1. Encounter for gynecological examination with Papanicolaou smear of cervix Pap sent Physical in 1 year Pap in 3 if normal Mammogram yearly Check CBC,CMP,TSH and lipids  2. Postmenopausal  3. Hypertension, unspecified type Continue Microzide Meds ordered this encounter  Medications  . hydrochlorothiazide (MICROZIDE) 12.5 MG capsule    Sig: TAKE 1 CAPSULE BY MOUTH EVERY DAY    Dispense:  90 capsule    Refill:  4    Order Specific Question:   Supervising Provider    Answer:   Malachy Mood, LUTHER H [[id number]]  . pramoxine-hydrocortisone (PROCTOCREAM-HC) 1-1 % rectal cream    Sig: Place 1 application rectally 2 (two) times daily.    Dispense:  30 g    Refill:  0    Order Specific Question:   Supervising Provider    Answer:   Despina Hidden, LUTHER H [[id number]]     4. Screening for colorectal cancer Colonoscopy advised   5. Hemorrhoids, unspecified hemorrhoid type Will rx analpram

## 2019-06-22 LAB — CYTOLOGY - PAP
Comment: NEGATIVE
Diagnosis: NEGATIVE
High risk HPV: NEGATIVE

## 2019-07-19 ENCOUNTER — Other Ambulatory Visit (HOSPITAL_COMMUNITY)
Admission: RE | Admit: 2019-07-19 | Discharge: 2019-07-19 | Disposition: A | Discharge status: Home / Self Care | Payer: BC Managed Care – PPO | Source: Ambulatory Visit | Attending: Adult Health | Admitting: Adult Health

## 2019-07-19 ENCOUNTER — Other Ambulatory Visit: Payer: Self-pay

## 2019-07-19 DIAGNOSIS — Z01419 Encounter for gynecological examination (general) (routine) without abnormal findings: Secondary | ICD-10-CM | POA: Diagnosis not present

## 2019-07-19 LAB — CBC
HCT: 38.5 % (ref 36.0–46.0)
Hemoglobin: 12.6 g/dL (ref 12.0–15.0)
MCH: 29.8 pg (ref 26.0–34.0)
MCHC: 32.7 g/dL (ref 30.0–36.0)
MCV: 91 fL (ref 80.0–100.0)
Platelets: 295 10*3/uL (ref 150–400)
RBC: 4.23 MIL/uL (ref 3.87–5.11)
RDW: 12.3 % (ref 11.5–15.5)
WBC: 6.9 10*3/uL (ref 4.0–10.5)
nRBC: 0 % (ref 0.0–0.2)

## 2019-07-19 LAB — COMPREHENSIVE METABOLIC PANEL
ALT: 22 U/L (ref 0–44)
AST: 21 U/L (ref 15–41)
Albumin: 4.2 g/dL (ref 3.5–5.0)
Alkaline Phosphatase: 97 U/L (ref 38–126)
Anion gap: 9 (ref 5–15)
BUN: 12 mg/dL (ref 6–20)
CO2: 25 mmol/L (ref 22–32)
Calcium: 9 mg/dL (ref 8.9–10.3)
Chloride: 103 mmol/L (ref 98–111)
Creatinine, Ser: 0.7 mg/dL (ref 0.44–1.00)
GFR calc Af Amer: >60 mL/min (ref >60)
GFR calc non Af Amer: >60 mL/min (ref >60)
Glucose, Bld: 101 mg/dL — ABNORMAL HIGH (ref 70–99)
Potassium: 3.6 mmol/L (ref 3.5–5.1)
Sodium: 137 mmol/L (ref 135–145)
Total Bilirubin: 0.8 mg/dL (ref 0.3–1.2)
Total Protein: 7.2 g/dL (ref 6.5–8.1)

## 2019-07-19 LAB — TSH: TSH: 1.451 u[IU]/mL (ref 0.350–4.500)

## 2019-07-19 LAB — LIPID PANEL
Cholesterol: 222 mg/dL — ABNORMAL HIGH (ref 0–200)
HDL: 60 mg/dL (ref >40)
LDL Cholesterol: 139 mg/dL — ABNORMAL HIGH (ref 0–99)
Total CHOL/HDL Ratio: 3.7 RATIO
Triglycerides: 115 mg/dL (ref <150)
VLDL: 23 mg/dL (ref 0–40)

## 2019-11-09 ENCOUNTER — Other Ambulatory Visit (HOSPITAL_COMMUNITY): Payer: Self-pay | Admitting: Obstetrics and Gynecology

## 2019-11-09 DIAGNOSIS — Z1231 Encounter for screening mammogram for malignant neoplasm of breast: Secondary | ICD-10-CM

## 2019-11-15 ENCOUNTER — Other Ambulatory Visit: Payer: Self-pay

## 2019-11-15 ENCOUNTER — Ambulatory Visit (HOSPITAL_COMMUNITY)
Admission: RE | Admit: 2019-11-15 | Discharge: 2019-11-15 | Disposition: A | Discharge status: Home / Self Care | Payer: BC Managed Care – PPO | Source: Ambulatory Visit | Attending: Obstetrics and Gynecology | Admitting: Obstetrics and Gynecology

## 2019-11-15 DIAGNOSIS — Z1231 Encounter for screening mammogram for malignant neoplasm of breast: Secondary | ICD-10-CM

## 2019-11-16 NOTE — Progress Notes (Signed)
Normal mammogram

## 2020-08-10 ENCOUNTER — Encounter: Payer: Self-pay | Admitting: Adult Health

## 2020-08-10 ENCOUNTER — Other Ambulatory Visit: Payer: Self-pay

## 2020-08-10 ENCOUNTER — Ambulatory Visit (INDEPENDENT_AMBULATORY_CARE_PROVIDER_SITE_OTHER): Payer: BC Managed Care – PPO | Admitting: Adult Health

## 2020-08-10 VITALS — BP 134/73 | HR 95 | Ht 63.0 in | Wt 192.0 lb

## 2020-08-10 DIAGNOSIS — I10 Essential (primary) hypertension: Secondary | ICD-10-CM

## 2020-08-10 DIAGNOSIS — K649 Unspecified hemorrhoids: Secondary | ICD-10-CM

## 2020-08-10 DIAGNOSIS — Z1211 Encounter for screening for malignant neoplasm of colon: Secondary | ICD-10-CM

## 2020-08-10 DIAGNOSIS — Z1212 Encounter for screening for malignant neoplasm of rectum: Secondary | ICD-10-CM

## 2020-08-10 DIAGNOSIS — Z780 Asymptomatic menopausal state: Secondary | ICD-10-CM | POA: Diagnosis not present

## 2020-08-10 DIAGNOSIS — Z01419 Encounter for gynecological examination (general) (routine) without abnormal findings: Secondary | ICD-10-CM

## 2020-08-10 DIAGNOSIS — Z1231 Encounter for screening mammogram for malignant neoplasm of breast: Secondary | ICD-10-CM

## 2020-08-10 LAB — HEMOCCULT GUIAC POC 1CARD (OFFICE): Fecal Occult Blood, POC: NEGATIVE

## 2020-08-10 MED ORDER — HYDROCHLOROTHIAZIDE 12.5 MG PO CAPS: ORAL_CAPSULE | ORAL | 4 refills | Status: DC

## 2020-08-10 NOTE — Progress Notes (Signed)
Patient ID: Kristen Hunt, female   DOB: 07-07-1960, 60 y.o.   MRN: [id number] History of Present Illness:  Kristen Hunt is a 60 year old Hispanic female, divorced, PM in for a well woman gyn exam. Lab Results  Component Value Date   DIAGPAP  06/21/2019    - Negative for intraepithelial lesion or malignancy (NILM)   HPVHIGH Negative 06/21/2019     Current Medications, Allergies, Past Medical History, Past Surgical History, Family History and Social History were reviewed in [employer] record.     Review of Systems: Patient denies any daily headaches(may have occasional when wakes up), hearing loss, fatigue, blurred vision, shortness of breath, chest pain, abdominal pain, problems with bowel movements, urination, or intercourse. No joint pain or mood swings.  Denies any vaginal bleeding   Physical Exam:BP 134/73 (BP Location: Right Arm, Patient Position: Sitting, Cuff Size: Normal)   Pulse 95   Ht 5\' 3"  (1.6 m)   Wt 192 lb (87.1 kg)   LMP 10/22/2012   BMI 34.01 kg/m   General:  Well developed, well nourished, no acute distress Skin:  Warm and dry Neck:  Midline trachea, normal thyroid, good ROM, no lymphadenopathy Lungs; Clear to auscultation bilaterally Breast:  No dominant palpable mass, retraction, or nipple discharge Cardiovascular: Regular rate and rhythm Abdomen:  Soft, non tender, no hepatosplenomegaly Pelvic:  External genitalia is normal in appearance, no lesions.  The vagina is normal in appearance. Urethra has no lesions or masses. The cervix is smooth.  Uterus is felt to be normal size, shape, and contour.  No adnexal masses or tenderness noted.Bladder is non tender, no masses felt. Rectal: Good sphincter tone, no polyps, +hemorrhoids felt.  Hemoccult negative. Extremities/musculoskeletal:  No swelling or varicosities noted, no clubbing or cyanosis Psych:  No mood changes, alert and cooperative,seems happy AA is 0 Fall risk is low Depression screen [facility]  2/9 08/10/2020 06/21/2019 12/31/2016  Decreased Interest 0 0 0  Down, Depressed, Hopeless 0 0 0  PHQ - 2 Score 0 0 0  Altered sleeping 0 1 -  Tired, decreased energy 0 0 -  Change in appetite 0 0 -  Feeling bad or failure about yourself  0 0 -  Trouble concentrating 0 0 -  Moving slowly or fidgety/restless 0 0 -  Suicidal thoughts 0 0 -  PHQ-9 Score 0 1 -  Difficult doing work/chores - Not difficult at all -    GAD 7 : Generalized Anxiety Score 08/10/2020 06/21/2019  Nervous, Anxious, on Edge 0 0  Control/stop worrying 0 0  Worry too much - different things 0 0  Trouble relaxing 0 0  Restless 0 0  Easily annoyed or irritable 0 0  Afraid - awful might happen 0 0  Total GAD 7 Score 0 0  Anxiety Difficulty - Not difficult at all      Upstream - 08/10/20 1157       Pregnancy Intention Screening   Does the patient want to become pregnant in the next year? No    Does the patient's partner want to become pregnant in the next year? No    Would the patient like to discuss contraceptive options today? No      Contraception Wrap Up   Current Method No Method - Other Reason   postmenopause   End Method No Method - Other Reason   post menopause   Contraception Counseling Provided No            Examination  chaperoned by Faith Rogue LPN   Impression and Plan: 1. Encounter for well woman exam with routine gynecological exam Physical in 1 year  Pap 2024 Will check labs,fasting  - CBC - Comprehensive metabolic panel - TSH - Lipid panel  2. Encounter for screening fecal occult blood testing   3. Postmenopausal   4. Hypertension, unspecified type Will refill Microzide Meds ordered this encounter  Medications   hydrochlorothiazide (MICROZIDE) 12.5 MG capsule    Sig: TAKE 1 CAPSULE BY MOUTH EVERY DAY    Dispense:  90 capsule    Refill:  4    Order Specific Question:   Supervising Provider    Answer:   Despina Hidden, LUTHER H [[id number]]     5. Hemorrhoids, unspecified hemorrhoid  type   6. Screening for colorectal cancer Referred to [facility] - Ambulatory referral to Gastroenterology  7. Screening mammogram for breast cancer Pt to call for appt, due in September   - MM 3D SCREEN BREAST BILATERAL; Future

## 2020-08-15 ENCOUNTER — Encounter: Payer: Self-pay | Admitting: Internal Medicine

## 2020-08-29 LAB — CBC
Hematocrit: 37.6 % (ref 34.0–46.6)
Hemoglobin: 12.6 g/dL (ref 11.1–15.9)
MCH: 29.2 pg (ref 26.6–33.0)
MCHC: 33.5 g/dL (ref 31.5–35.7)
MCV: 87 fL (ref 79–97)
Platelets: 283 10*3/uL (ref 150–450)
RBC: 4.31 x10E6/uL (ref 3.77–5.28)
RDW: 12.3 % (ref 11.7–15.4)
WBC: 6.6 10*3/uL (ref 3.4–10.8)

## 2020-08-29 LAB — COMPREHENSIVE METABOLIC PANEL
ALT: 25 IU/L (ref 0–32)
AST: 24 IU/L (ref 0–40)
Albumin/Globulin Ratio: 1.7 (ref 1.2–2.2)
Albumin: 4.3 g/dL (ref 3.8–4.9)
Alkaline Phosphatase: 122 IU/L — ABNORMAL HIGH (ref 44–121)
BUN/Creatinine Ratio: 17 (ref 9–23)
BUN: 13 mg/dL (ref 6–24)
Bilirubin Total: 0.5 mg/dL (ref 0.0–1.2)
CO2: 22 mmol/L (ref 20–29)
Calcium: 9.2 mg/dL (ref 8.7–10.2)
Chloride: 104 mmol/L (ref 96–106)
Creatinine, Ser: 0.75 mg/dL (ref 0.57–1.00)
Globulin, Total: 2.5 g/dL (ref 1.5–4.5)
Glucose: 105 mg/dL — ABNORMAL HIGH (ref 65–99)
Potassium: 4.2 mmol/L (ref 3.5–5.2)
Sodium: 139 mmol/L (ref 134–144)
Total Protein: 6.8 g/dL (ref 6.0–8.5)
eGFR: 92 mL/min/{1.73_m2} (ref >59)

## 2020-08-29 LAB — LIPID PANEL
Chol/HDL Ratio: 3.8 ratio (ref 0.0–4.4)
Cholesterol, Total: 197 mg/dL (ref 100–199)
HDL: 52 mg/dL
LDL Chol Calc (NIH): 125 mg/dL — ABNORMAL HIGH (ref 0–99)
Triglycerides: 109 mg/dL (ref 0–149)
VLDL Cholesterol Cal: 20 mg/dL (ref 5–40)

## 2020-08-29 LAB — TSH: TSH: 1.62 u[IU]/mL (ref 0.450–4.500)

## 2020-09-19 ENCOUNTER — Ambulatory Visit: Payer: BC Managed Care – PPO

## 2020-11-22 ENCOUNTER — Other Ambulatory Visit: Payer: Self-pay

## 2020-11-22 ENCOUNTER — Ambulatory Visit (INDEPENDENT_AMBULATORY_CARE_PROVIDER_SITE_OTHER): Payer: Self-pay | Admitting: *Deleted

## 2020-11-22 VITALS — Ht 64.0 in | Wt 190.8 lb

## 2020-11-22 DIAGNOSIS — Z1211 Encounter for screening for malignant neoplasm of colon: Secondary | ICD-10-CM

## 2020-11-22 MED ORDER — CLENPIQ 10-3.5-12 MG-GM -GM/160ML PO SOLN: 1.0000 | Freq: Once | ORAL | 0 refills | Status: AC

## 2020-11-22 NOTE — Patient Instructions (Addendum)
CLENPIQ INSTRUCTIONS  Remember labs at [facility] on 12/15/2020.   Patient Name:  Kristen Hunt Date of procedure:  12/19/2020 Time to register at [place] Stay:  8:00 AM Provider:  Dr. Abbey Chatters  Please notify us immediately if you are diabetic, take iron supplements, or if you are on coumadin or any blood thinners.  Please hold the following medications: n/a  Note: Do NOT refrigerate or freeze CLENPIQ. CLENPIQ is ready to drink. There is no need to add any other liquid or mix the medicine in the bottle before you start dosing.   12/18/2020-  1 Day prior to procedure:    CLEAR LIQUIDS ALL DAY--NO SOLID FOODS OR DAIRY PRODUCTS! See list of liquids that are allowed and items that are NOT allowed below.  Diabetic Medication Instructions:  n/a  You must drink plenty of CLEAR LIQUIDS starting before your bowel prep. It is important to stay adequately hydrated before, during, and after your bowel prep for the prep to work effectively!  At 4:00 PM Begin the prep as follows:    Drink one bottle of pre-mixed CLENPIQ right from the bottle.  Drink at least five (5) 8-ounce drinks of clear liquids of your choice within the next 5 hours  At 10:00 PM: Drink the second bottle of pre-mixed CLENPIQ right from the bottle.   Drink at least three (3) 8-ounce drinks of clear liquids of your choice within the next 3 hours before going to bed.  Continue clear liquids.    12/19/2020-  Day of Procedure:  Diabetic medications adjustments: n/a  You may take TYLENOL products.  Please continue your regular medications unless we have instructed you otherwise.    At 3 hours before procedure @ 6:30 am: Stop drinking all liquids, nothing by mouth at this point.   Please note, on the day of your procedure you MUST be accompanied by an adult who is willing to assume responsibility for you at time of discharge. If you do not have such person with you, your procedure will have to be rescheduled.                                                                                                                      Please leave ALL jewelry at home prior to coming to the hospital for your procedure.   *It is your responsibility to check with your insurance company for the benefits of coverage you have for this procedure. Unfortunately, not all insurance companies have benefits to cover all or part of these types of procedures. It is your responsibility to check your benefits, however we will be glad to assist you with any codes your insurance company may need.   Please note that most insurance companies will not cover a screening colonoscopy for people under the age of 31  For example, with some insurance companies you may have benefits for a screening colonoscopy, but if polyps are found the diagnosis will change and then you may have a deductible  that will need to be met. Please make sure you check your benefits for screening colonoscopy as well as a diagnostic colonoscopy.    CLEAR LIQUIDS: (NO RED or PURPLE) Water  Jello   Apple Juice  White Grape Juice   Kool-Aid Soft drinks  Banana popsicles Sports Drink  Black coffee (No cream or milk) Tea (No cream or milk)  Broth (fat free beef/chicken/vegetable)  Clear liquids allow you to see your fingers on the other side of the glass.  Be sure they are NOT RED or PURPLE in color, cloudy, but CLEAR.  Do Not Eat: Dairy products of any kind Cranberry juice Tomato or V8 Juice  Orange Juice   Grapefruit Juice Red Grape Juice Alcohol   Non-dairy creamer Solid foods like cereal, oatmeal, yogurt, fruits, vegetables, creamed soups, eggs, bread, etc   HELPFUL HINTS TO MAKE DRINKING EASIER: -Trying drinking through a straw. -If you become nauseated, try consuming smaller amounts or stretch out the time between glasses.  Stop for 30 minutes & slowly start back drinking.  Call our office with any questions or concerns at [phone].  Thank You,  Christ Kick, [place]

## 2020-11-22 NOTE — Progress Notes (Signed)
Pt made aware to have labs drawn on 12/15/2020.

## 2020-11-22 NOTE — Progress Notes (Signed)
Gastroenterology Pre-Procedure Review  Request Date: 11/22/2020 Requesting Physician: Cyril Mourning, NP @ [facility], no previous TCS  PATIENT REVIEW QUESTIONS: The patient responded to the following health history questions as indicated:    1. Diabetes Melitis: no 2. Joint replacements in the past 12 months: no 3. Major health problems in the past 3 months: no 4. Has an artificial valve or MVP: no 5. Has a defibrillator: no 6. Has been advised in past to take antibiotics in advance of a procedure like teeth cleaning: no 7. Family history of colon cancer: no  8. Alcohol Use: no 9. Illicit drug Use: no 10. History of sleep apnea: no  11. History of coronary artery or other vascular stents placed within the last 12 months: no 12. History of any prior anesthesia complications: no 13. Body mass index is 32.75 kg/m.    MEDICATIONS & ALLERGIES:    Patient reports the following regarding taking any blood thinners:   Plavix? no Aspirin? no Coumadin? no Brilinta? no Xarelto? no Eliquis? no Pradaxa? no Savaysa? no Effient? no  Patient confirms/reports the following medications:  Current Outpatient Medications  Medication Sig Dispense Refill   Ascorbic Acid (VITA-C PO) Take by mouth daily.     B Complex Vitamins (VITAMIN-B COMPLEX PO) Take by mouth daily.     Calcium Carbonate (CALCIUM 500 PO) Take by mouth daily.     hydrochlorothiazide (MICROZIDE) 12.5 MG capsule TAKE 1 CAPSULE BY MOUTH EVERY DAY 90 capsule 4   Omega-3 Fatty Acids (OMEGA 3 PO) Take by mouth daily.     No current facility-administered medications for this visit.    Patient confirms/reports the following allergies:  No Known Allergies  No orders of the defined types were placed in this encounter.   AUTHORIZATION INFORMATION Primary Insurance: [place],  [place] #: [id number],  Group #: [id number] Pre-Cert / Berkley Harvey required: No, not required  SCHEDULE INFORMATION: Procedure has been scheduled as  follows:  Date: 12/19/2020, Time: 9:30  Location: [facility] with Dr. Marletta Lor  This Gastroenterology Pre-Precedure Review Form is being routed to the following provider(s): Tana Coast, PA-C

## 2020-11-22 NOTE — Progress Notes (Signed)
Ok to schedule. ASA I. 

## 2020-11-23 ENCOUNTER — Other Ambulatory Visit: Payer: Self-pay | Admitting: *Deleted

## 2020-11-23 ENCOUNTER — Ambulatory Visit (HOSPITAL_COMMUNITY)
Admission: RE | Admit: 2020-11-23 | Discharge: 2020-11-23 | Disposition: A | Discharge status: Home / Self Care | Payer: BC Managed Care – PPO | Source: Ambulatory Visit | Attending: Adult Health | Admitting: Adult Health

## 2020-11-23 DIAGNOSIS — Z1231 Encounter for screening mammogram for malignant neoplasm of breast: Secondary | ICD-10-CM | POA: Diagnosis not present

## 2020-11-23 DIAGNOSIS — Z1211 Encounter for screening for malignant neoplasm of colon: Secondary | ICD-10-CM

## 2020-12-15 ENCOUNTER — Other Ambulatory Visit (HOSPITAL_COMMUNITY)
Admission: RE | Admit: 2020-12-15 | Discharge: 2020-12-15 | Disposition: A | Discharge status: Home / Self Care | Payer: BC Managed Care – PPO | Source: Ambulatory Visit | Attending: Internal Medicine | Admitting: Internal Medicine

## 2020-12-15 DIAGNOSIS — Z1211 Encounter for screening for malignant neoplasm of colon: Secondary | ICD-10-CM | POA: Insufficient documentation

## 2020-12-15 LAB — BASIC METABOLIC PANEL
Anion gap: 6 (ref 5–15)
BUN: 11 mg/dL (ref 6–20)
CO2: 27 mmol/L (ref 22–32)
Calcium: 8.9 mg/dL (ref 8.9–10.3)
Chloride: 103 mmol/L (ref 98–111)
Creatinine, Ser: 0.75 mg/dL (ref 0.44–1.00)
GFR, Estimated: >60 mL/min (ref >60)
Glucose, Bld: 129 mg/dL — ABNORMAL HIGH (ref 70–99)
Potassium: 3.6 mmol/L (ref 3.5–5.1)
Sodium: 136 mmol/L (ref 135–145)

## 2020-12-19 ENCOUNTER — Ambulatory Visit (HOSPITAL_COMMUNITY): Payer: BC Managed Care – PPO | Admitting: Certified Registered Nurse Anesthetist

## 2020-12-19 ENCOUNTER — Ambulatory Visit (HOSPITAL_COMMUNITY)
Admission: RE | Admit: 2020-12-19 | Discharge: 2020-12-19 | Disposition: A | Discharge status: Home / Self Care | Payer: BC Managed Care – PPO | Attending: Internal Medicine | Admitting: Internal Medicine

## 2020-12-19 ENCOUNTER — Other Ambulatory Visit: Payer: Self-pay

## 2020-12-19 ENCOUNTER — Encounter (HOSPITAL_COMMUNITY): Payer: Self-pay

## 2020-12-19 ENCOUNTER — Encounter (HOSPITAL_COMMUNITY)
Admission: RE | Disposition: A | Discharge status: Home / Self Care | Payer: Self-pay | Source: Home / Self Care | Attending: Internal Medicine

## 2020-12-19 DIAGNOSIS — Z79899 Other long term (current) drug therapy: Secondary | ICD-10-CM | POA: Diagnosis not present

## 2020-12-19 DIAGNOSIS — Z1211 Encounter for screening for malignant neoplasm of colon: Secondary | ICD-10-CM | POA: Insufficient documentation

## 2020-12-19 DIAGNOSIS — I10 Essential (primary) hypertension: Secondary | ICD-10-CM | POA: Diagnosis not present

## 2020-12-19 DIAGNOSIS — K5730 Diverticulosis of large intestine without perforation or abscess without bleeding: Secondary | ICD-10-CM | POA: Insufficient documentation

## 2020-12-19 DIAGNOSIS — K648 Other hemorrhoids: Secondary | ICD-10-CM | POA: Diagnosis not present

## 2020-12-19 HISTORY — PX: COLONOSCOPY WITH PROPOFOL: SHX5780

## 2020-12-19 SURGERY — COLONOSCOPY WITH PROPOFOL
Anesthesia: General

## 2020-12-19 MED ORDER — PROPOFOL 500 MG/50ML IV EMUL
INTRAVENOUS | Status: DC | PRN
  Administered 2020-12-19: 150 ug/kg/min via INTRAVENOUS

## 2020-12-19 MED ORDER — LACTATED RINGERS IV SOLN: INTRAVENOUS | Status: DC

## 2020-12-19 MED ORDER — PROPOFOL 10 MG/ML IV BOLUS
INTRAVENOUS | Status: DC | PRN
  Administered 2020-12-19: 100 mg via INTRAVENOUS

## 2020-12-19 NOTE — Op Note (Signed)
[facility] Patient Name: Kristen Hunt Procedure Date: 12/19/2020 9:07 AM MRN: [id number] Date of Birth: August 10, 1960 Attending MD: Elon Alas. Abbey Chatters DO CSN: [id number] Age: 60 Admit Type: Outpatient Procedure:                Colonoscopy Indications:              Screening for colorectal malignant neoplasm Providers:                Elon Alas. Abbey Chatters, DO, Jessica Boudreaux, Casimer Bilis, Technician Referring MD:              Medicines:                See the Anesthesia note for documentation of the                            administered medications Complications:            No immediate complications. Estimated Blood Loss:     Estimated blood loss: none. Procedure:                Pre-Anesthesia Assessment:                           - The anesthesia plan was to use monitored                            anesthesia care (MAC).                           After obtaining informed consent, the colonoscope                            was passed under direct vision. Throughout the                            procedure, the patient's blood pressure, pulse, and                            oxygen saturations were monitored continuously. The                            [id number]-HQ190L ([id number]) scope was introduced through                            the anus and advanced to the the cecum, identified                            by appendiceal orifice and ileocecal valve. The                            colonoscopy was performed without difficulty. The                            patient tolerated the procedure well. The quality  of the bowel preparation was evaluated using the                            BBPS [facility] Bowel Preparation Scale) with scores                            of: Right Colon = 3, Transverse Colon = 3 and Left                            Colon = 3 (entire mucosa seen well with no residual                            staining,  small fragments of stool or opaque                            liquid). The total BBPS score equals 9. Scope In: 9:19:45 AM Scope Out: 9:29:59 AM Scope Withdrawal Time: 0 hours 6 minutes 55 seconds  Total Procedure Duration: 0 hours 10 minutes 14 seconds  Findings:      The perianal and digital rectal examinations were normal.      Non-bleeding internal hemorrhoids were found during endoscopy.      Multiple small and large-mouthed diverticula were found in the sigmoid       colon.      The exam was otherwise without abnormality. Impression:               - Non-bleeding internal hemorrhoids.                           - Diverticulosis in the sigmoid colon.                           - The examination was otherwise normal.                           - No specimens collected. Moderate Sedation:      Per Anesthesia Care Recommendation:           - Patient has a contact number available for                            emergencies. The signs and symptoms of potential                            delayed complications were discussed with the                            patient. Return to normal activities tomorrow.                            Written discharge instructions were provided to the                            patient.                           -  Resume previous diet.                           - Continue present medications.                           - Repeat colonoscopy in 10 years for screening                            purposes.                           - Return to GI clinic PRN. Procedure Code(s):        --- Professional ---                           W3888, Colorectal cancer screening; colonoscopy on                            individual not meeting criteria for high risk Diagnosis Code(s):        --- Professional ---                           Z12.11, Encounter for screening for malignant                            neoplasm of colon                           K64.8, Other  hemorrhoids                           K57.30, Diverticulosis of large intestine without                            perforation or abscess without bleeding CPT copyright 2019 American Medical Association. All rights reserved. The codes documented in this report are preliminary and upon coder review may  be revised to meet current compliance requirements. Elon Alas. Abbey Chatters, DO [place] Theus Espin, DO 12/19/2020 9:32:11 AM This report has been signed electronically. Number of Addenda: 0

## 2020-12-19 NOTE — Discharge Instructions (Addendum)

## 2020-12-19 NOTE — H&P (Signed)
Primary Care Physician:  Tilda Burrow, MD (Inactive) Primary Gastroenterologist:  Dr. Marletta Lor  Pre-Procedure History & Physical: HPI:  Kristen Hunt is a 60 y.o. female is here for a colonoscopy for colon cancer screening purposes.  Patient denies any family history of colorectal cancer.  No melena or hematochezia.  No abdominal pain or unintentional weight loss.  No change in bowel habits.  Overall feels well from a GI standpoint.  Past Medical History:  Diagnosis Date   Hypertension    Postmenopausal 11/19/2013    Past Surgical History:  Procedure Laterality Date   CESAREAN SECTION      Prior to Admission medications   Medication Sig Start Date End Date Taking? Authorizing Provider  B Complex-Biotin-FA (B COMPLETE PO) Take 1 tablet by mouth in the morning. Nutrilite Vitamin B Dual-Action  clear guard   Yes [provider]  Calcium-Magnesium-Vitamin D (SUPER CAL-MAG-D PO) Take 1 tablet by mouth 2 (two) times a week.   Yes [provider]  carboxymethylcellulose (REFRESH PLUS) 0.5 % SOLN Place 1-2 drops into both eyes 3 (three) times daily as needed (dry/irritated eyes.).   Yes [provider]  Coenzyme Q10-Omega 3 Fatty Acd (HEALTHY HEART PO) Take 1 capsule by mouth 2 (two) times a week. Nutrilite Heart Health Omega   Yes [provider]  GARLIC PO Take 4,193 mg by mouth in the morning. 600 mg/tablet   Yes [provider]  hydrochlorothiazide (MICROZIDE) 12.5 MG capsule TAKE 1 CAPSULE BY MOUTH EVERY DAY 08/10/20  Yes Cyril Mourning A, NP  ibuprofen (ADVIL) 200 MG tablet Take 400-600 mg by mouth every 8 (eight) hours as needed (pain.).   Yes [provider]  Omega 3-6-9 Fatty Acids (OMEGA 3-6-9 COMPLEX PO) Take 2 capsules by mouth in the morning. Nutrilite Balanced Health Omega   Yes [provider]  OVER THE COUNTER MEDICATION Take 2 tablets by mouth daily. Nutrilite ClearGuard   Yes [provider]  Probiotic PACK  Take 1 packet by mouth daily.   Yes [provider]  vitamin C (ASCORBIC ACID) 500 MG tablet Take 500 mg by mouth in the morning.   Yes [provider]    Allergies as of 11/23/2020   (No Known Allergies)    Family History  Problem Relation Age of Onset   Hypertension Mother    Diabetes Mother    Hyperlipidemia Mother    Thyroid disease Mother    Heart disease Mother        pacemaker   Dementia Mother    Hyperlipidemia Father    Stroke Father     Social History   Socioeconomic History   Marital status: Divorced    Spouse name: Not on file   Number of children: Not on file   Years of education: Not on file   Highest education level: Not on file  Occupational History   Not on file  Tobacco Use   Smoking status: Never   Smokeless tobacco: Never  Vaping Use   Vaping Use: Never used  Substance and Sexual Activity   Alcohol use: No   Drug use: No   Sexual activity: Yes    Birth control/protection: Post-menopausal  Other Topics Concern   Not on file  Social History Narrative   Not on file   Social Determinants of Health   Financial Resource Strain: Low Risk    Difficulty of Paying Living Expenses: Not very hard  Food Insecurity: No Food Insecurity   Worried  About Running Out of Food in the Last Year: Never true   Ran Out of Food in the Last Year: Never true  Transportation Needs: No Transportation Needs   Lack of Transportation (Medical): No   Lack of Transportation (Non-Medical): No  Physical Activity: Sufficiently Active   Days of Exercise per Week: 4 days   Minutes of Exercise per Session: 60 min  Stress: No Stress Concern Present   Feeling of Stress : Only a little  Social Connections: Moderately Integrated   Frequency of Communication with Friends and Family: More than three times a week   Frequency of Social Gatherings with Friends and Family: Three times a week   Attends Religious Services: 1 to 4 times per year   Active Member of  Clubs or Organizations: No   Attends Banker Meetings: Never   Marital Status: Living with partner  Intimate Partner Violence: Not At Risk   Fear of Current or Ex-Partner: No   Emotionally Abused: No   Physically Abused: No   Sexually Abused: No    Review of Systems: See HPI, otherwise negative ROS  Physical Exam: Vital signs in last 24 hours: Temp:  [98.5 F (36.9 C)] 98.5 F (36.9 C) (10/18 0826) Pulse Rate:  [96] 96 (10/18 0826) Resp:  [12] 12 (10/18 0826) BP: (150)/(72) 150/72 (10/18 0826) SpO2:  [99 %] 99 % (10/18 0826) Weight:  [86.2 kg] 86.2 kg (10/18 0826)   General:   Alert,  Well-developed, well-nourished, pleasant and cooperative in NAD Head:  Normocephalic and atraumatic. Eyes:  Sclera clear, no icterus.   Conjunctiva pink. Ears:  Normal auditory acuity. Nose:  No deformity, discharge,  or lesions. Mouth:  No deformity or lesions, dentition normal. Neck:  Supple; no masses or thyromegaly. Lungs:  Clear throughout to auscultation.   No wheezes, crackles, or rhonchi. No acute distress. Heart:  Regular rate and rhythm; no murmurs, clicks, rubs,  or gallops. Abdomen:  Soft, nontender and nondistended. No masses, hepatosplenomegaly or hernias noted. Normal bowel sounds, without guarding, and without rebound.   Msk:  Symmetrical without gross deformities. Normal posture. Extremities:  Without clubbing or edema. Neurologic:  Alert and  oriented x4;  grossly normal neurologically. Skin:  Intact without significant lesions or rashes. Cervical Nodes:  No significant cervical adenopathy. Psych:  Alert and cooperative. Normal mood and affect.  Impression/Plan: Kristen Hunt is here for a colonoscopy to be performed for colon cancer screening purposes.  The risks of the procedure including infection, bleed, or perforation as well as benefits, limitations, alternatives and imponderables have been reviewed with the patient. Questions have been answered. All  parties agreeable.

## 2020-12-19 NOTE — Anesthesia Postprocedure Evaluation (Signed)
Anesthesia Post Note  Patient: Kristen Hunt  Procedure(s) Performed: COLONOSCOPY WITH PROPOFOL  Patient location during evaluation: Phase II Anesthesia Type: General Level of consciousness: awake Pain management: pain level controlled Vital Signs Assessment: post-procedure vital signs reviewed and stable Respiratory status: spontaneous breathing and respiratory function stable Cardiovascular status: blood pressure returned to baseline and stable Postop Assessment: no headache and no apparent nausea or vomiting Anesthetic complications: no Comments: Late entry   No notable events documented.   Last Vitals:  Vitals:   12/19/20 0826 12/19/20 0933  BP: (!) 150/72 (!) 92/51  Pulse: 96 77  Resp: 12 12  Temp: 36.9 C 36.9 C  SpO2: 99% 97%    Last Pain:  Vitals:   12/19/20 0933  TempSrc: Oral  PainSc: 0-No pain                 Windell Norfolk

## 2020-12-19 NOTE — Anesthesia Preprocedure Evaluation (Signed)
Anesthesia Evaluation  Patient identified by MRN, date of birth, ID band Patient awake    Reviewed: Allergy & Precautions, H&P , NPO status , Patient's Chart, lab work & pertinent test results, reviewed documented beta blocker date and time   Airway Mallampati: II  TM Distance: >3 FB Neck ROM: full    Dental no notable dental hx.    Pulmonary neg pulmonary ROS,    Pulmonary exam normal breath sounds clear to auscultation       Cardiovascular Exercise Tolerance: Good hypertension, negative cardio ROS   Rhythm:regular Rate:Normal     Neuro/Psych negative neurological ROS  negative psych ROS   GI/Hepatic negative GI ROS, Neg liver ROS,   Endo/Other  negative endocrine ROS  Renal/GU negative Renal ROS  negative genitourinary   Musculoskeletal   Abdominal   Peds  Hematology negative hematology ROS (+)   Anesthesia Other Findings   Reproductive/Obstetrics negative OB ROS                             Anesthesia Physical Anesthesia Plan  ASA: 2  Anesthesia Plan: General   Post-op Pain Management:    Induction:   PONV Risk Score and Plan: Propofol infusion  Airway Management Planned:   Additional Equipment:   Intra-op Plan:   Post-operative Plan:   Informed Consent: I have reviewed the patients History and Physical, chart, labs and discussed the procedure including the risks, benefits and alternatives for the proposed anesthesia with the patient or authorized representative who has indicated his/her understanding and acceptance.     Dental Advisory Given  Plan Discussed with: CRNA  Anesthesia Plan Comments:         Anesthesia Quick Evaluation  

## 2020-12-19 NOTE — Transfer of Care (Signed)
Immediate Anesthesia Transfer of Care Note  Patient: Kristen Hunt  Procedure(s) Performed: COLONOSCOPY WITH PROPOFOL  Patient Location: Endoscopy Unit  Anesthesia Type:General  Level of Consciousness: drowsy  Airway & Oxygen Therapy: Patient Spontanous Breathing  Post-op Assessment: Report given to RN and Post -op Vital signs reviewed and stable  Post vital signs: Reviewed and stable  Last Vitals:  Vitals Value Taken Time  BP    Temp    Pulse    Resp    SpO2      Last Pain:  Vitals:   12/19/20 0916  TempSrc:   PainSc: 0-No pain      Patients Stated Pain Goal: 9 (12/19/20 0826)  Complications: No notable events documented.

## 2020-12-22 ENCOUNTER — Encounter (HOSPITAL_COMMUNITY): Payer: Self-pay | Admitting: Internal Medicine

## 2021-10-21 ENCOUNTER — Other Ambulatory Visit: Payer: Self-pay | Admitting: Adult Health

## 2021-11-13 ENCOUNTER — Other Ambulatory Visit (HOSPITAL_COMMUNITY): Payer: Self-pay | Admitting: Adult Health

## 2021-11-13 DIAGNOSIS — Z1231 Encounter for screening mammogram for malignant neoplasm of breast: Secondary | ICD-10-CM

## 2021-11-26 ENCOUNTER — Ambulatory Visit (HOSPITAL_COMMUNITY)
Admission: RE | Admit: 2021-11-26 | Discharge: 2021-11-26 | Disposition: A | Discharge status: Home / Self Care | Payer: BC Managed Care – PPO | Source: Ambulatory Visit | Attending: Adult Health | Admitting: Adult Health

## 2021-11-26 DIAGNOSIS — Z1231 Encounter for screening mammogram for malignant neoplasm of breast: Secondary | ICD-10-CM | POA: Diagnosis not present

## 2022-01-20 ENCOUNTER — Other Ambulatory Visit: Payer: Self-pay | Admitting: Adult Health

## 2022-03-19 ENCOUNTER — Ambulatory Visit: Payer: BC Managed Care – PPO | Admitting: Internal Medicine

## 2022-04-25 ENCOUNTER — Encounter: Payer: Self-pay | Admitting: Internal Medicine

## 2022-04-25 ENCOUNTER — Ambulatory Visit: Payer: BC Managed Care – PPO | Admitting: Internal Medicine

## 2022-04-25 VITALS — BP 147/87 | HR 83 | Ht 64.0 in | Wt 188.0 lb

## 2022-04-25 DIAGNOSIS — R221 Localized swelling, mass and lump, neck: Secondary | ICD-10-CM

## 2022-04-25 DIAGNOSIS — Z8719 Personal history of other diseases of the digestive system: Secondary | ICD-10-CM

## 2022-04-25 DIAGNOSIS — Z0001 Encounter for general adult medical examination with abnormal findings: Secondary | ICD-10-CM | POA: Diagnosis not present

## 2022-04-25 DIAGNOSIS — I10 Essential (primary) hypertension: Secondary | ICD-10-CM | POA: Diagnosis not present

## 2022-04-25 DIAGNOSIS — Z1321 Encounter for screening for nutritional disorder: Secondary | ICD-10-CM

## 2022-04-25 DIAGNOSIS — Z13220 Encounter for screening for lipoid disorders: Secondary | ICD-10-CM

## 2022-04-25 DIAGNOSIS — Z2821 Immunization not carried out because of patient refusal: Secondary | ICD-10-CM

## 2022-04-25 DIAGNOSIS — R109 Unspecified abdominal pain: Secondary | ICD-10-CM

## 2022-04-25 DIAGNOSIS — Z131 Encounter for screening for diabetes mellitus: Secondary | ICD-10-CM

## 2022-04-25 DIAGNOSIS — Z1329 Encounter for screening for other suspected endocrine disorder: Secondary | ICD-10-CM

## 2022-04-25 NOTE — Assessment & Plan Note (Signed)
She is currently prescribed HCTZ 12.5 mg daily for treatment of hypertension.  Her initial blood pressure today was 143/83 and 147/87 on repeat.  She regularly checks her blood pressure at home and reports systolic readings typically 120-130 mmHg. -No medication changes today.  Follow-up in 4 weeks and reassess BP at that time.

## 2022-04-25 NOTE — Assessment & Plan Note (Signed)
Presenting today to establish care.  Previous records and labs been reviewed. -Baseline labs ordered today -Influenza vaccine has been declined -Additional HM items are up-to-date -We will tentatively plan for follow-up in 1 month for reassessment

## 2022-04-25 NOTE — Patient Instructions (Signed)
It was a pleasure to see you today.  Thank you for giving [place] the opportunity to be involved in your care.  Below is a brief recap of your visit and next steps.  We will plan to see you again in 1 month.  Summary You have established care today We will check baseline labs I have ordered ultrasounds of your neck and abdomen to further evaluate your symptoms We will follow up in 1 month

## 2022-04-25 NOTE — Progress Notes (Signed)
New Patient Office Visit  Subjective    Patient ID: Kristen Hunt, female    DOB: 02/02/1961  Age: 62 y.o. MRN: [id number]  CC:  Chief Complaint  Patient presents with   Establish Care    HPI Kristen Hunt presents to establish care.  She is a 62 year old woman who endorses a past medical history significant for essential hypertension, cholelithiasis, and thyroid nodules.  She has not had a primary care provider previously, but has been followed by [facility].  Ms. Jagers acute concern today is chronic right-sided abdominal pain.  She states that this has been present for many months.  Her abdominal pain is worse after eating greasy meals.  She has otherwise not identified any exacerbating or alleviating factors.  Of note, she was told that she had gallstones in [place].  She states that she is currently taking a natural medication from [place] for treatment.  She is otherwise asymptomatic and has no additional concerns to discuss today.  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  She denies tobacco, alcohol, and illicit drug use.  Her family medical history is significant for hyperlipidemia, diabetes mellitus, and unspecified cancer in her maternal aunt.  Acute concerns, chronic medical conditions, and outstanding preventative care items discussed today are individually addressed in A/P below.  Outpatient Encounter Medications as of 04/25/2022  Medication Sig   B Complex-Biotin-FA (B COMPLETE PO) Take 1 tablet by mouth in the morning. Nutrilite Vitamin B Dual-Action  clear guard   Calcium-Magnesium-Vitamin D (SUPER CAL-MAG-D PO) Take 1 tablet by mouth 2 (two) times a week.   carboxymethylcellulose (REFRESH PLUS) 0.5 % SOLN Place 1-2 drops into both eyes 3 (three) times daily as needed (dry/irritated eyes.).   GARLIC PO Take [id number] mg by mouth in the morning. 600 mg/tablet   hydrochlorothiazide (MICROZIDE) 12.5 MG capsule TAKE 1 CAPSULE BY MOUTH EVERY  DAY   ibuprofen (ADVIL) 200 MG tablet Take 400-600 mg by mouth every 8 (eight) hours as needed (pain.).   Omega 3-6-9 Fatty Acids (OMEGA 3-6-9 COMPLEX PO) Take 2 capsules by mouth in the morning. Nutrilite Balanced Health Omega   OVER THE COUNTER MEDICATION Take 2 tablets by mouth daily. Nutrilite ClearGuard   Probiotic PACK Take 1 packet by mouth daily.   vitamin C (ASCORBIC ACID) 500 MG tablet Take 500 mg by mouth in the morning.   [DISCONTINUED] Coenzyme Q10-Omega 3 Fatty Acd (HEALTHY HEART PO) Take 1 capsule by mouth 2 (two) times a week. Nutrilite Heart Health Omega   No facility-administered encounter medications on file as of 04/25/2022.    Past Medical History:  Diagnosis Date   Hypertension    Postmenopausal 11/19/2013    Past Surgical History:  Procedure Laterality Date   CESAREAN SECTION     COLONOSCOPY WITH PROPOFOL N/A 12/19/2020   Procedure: COLONOSCOPY WITH PROPOFOL;  Surgeon: Eloise Harman, DO;  Location: [facility];  Service: Endoscopy;  Laterality: N/A;  9:30 / ASA I    Family History  Problem Relation Age of Onset   Hypertension Mother    Diabetes Mother    Hyperlipidemia Mother    Thyroid disease Mother    Heart disease Mother        pacemaker   Dementia Mother    Hyperlipidemia Father    Stroke Father     Social History   Socioeconomic History   Marital status: Married    Spouse name: Not on file  Number of children: Not on file   Years of education: Not on file   Highest education level: Not on file  Occupational History   Not on file  Tobacco Use   Smoking status: Never   Smokeless tobacco: Never  Vaping Use   Vaping Use: Never used  Substance and Sexual Activity   Alcohol use: No   Drug use: No   Sexual activity: Yes    Birth control/protection: Post-menopausal  Other Topics Concern   Not on file  Social History Narrative   Not on file   Social Determinants of Health   Financial Resource Strain: Low Risk  (08/10/2020)    Overall Financial Resource Strain (CARDIA)    Difficulty of Paying Living Expenses: Not very hard  Food Insecurity: No Food Insecurity (08/10/2020)   Hunger Vital Sign    Worried About Running Out of Food in the Last Year: Never true    Ran Out of Food in the Last Year: Never true  Transportation Needs: No Transportation Needs (08/10/2020)   PRAPARE - Hydrologist (Medical): No    Lack of Transportation (Non-Medical): No  Physical Activity: Sufficiently Active (08/10/2020)   Exercise Vital Sign    Days of Exercise per Week: 4 days    Minutes of Exercise per Session: 60 min  Stress: No Stress Concern Present (08/10/2020)   [place]    Feeling of Stress : Only a little  Social Connections: Moderately Integrated (08/10/2020)   Social Connection and Isolation Panel [NHANES]    Frequency of Communication with Friends and Family: More than three times a week    Frequency of Social Gatherings with Friends and Family: Three times a week    Attends Religious Services: 1 to 4 times per year    Active Member of Clubs or Organizations: No    Attends Archivist Meetings: Never    Marital Status: Living with partner  Intimate Partner Violence: Not At Risk (08/10/2020)   Humiliation, Afraid, Rape, and Kick questionnaire    Fear of Current or Ex-Partner: No    Emotionally Abused: No    Physically Abused: No    Sexually Abused: No    Review of Systems  Constitutional:  Negative for chills and fever.  HENT:  Negative for sore throat.   Respiratory:  Negative for cough and shortness of breath.   Cardiovascular:  Negative for chest pain, palpitations and leg swelling.  Gastrointestinal:  Positive for abdominal pain (RUQ/RLQ abdominal pain (chronic)). Negative for blood in stool, constipation, diarrhea, nausea and vomiting.  Genitourinary:  Negative for dysuria and hematuria.  Musculoskeletal:  Negative  for myalgias.  Skin:  Negative for itching and rash.  Neurological:  Negative for dizziness and headaches.  Psychiatric/Behavioral:  Negative for depression and suicidal ideas.         Objective    BP (!) 147/87   Pulse 83   Ht 5' 4"$  (1.626 m)   Wt 188 lb (85.3 kg)   LMP 10/22/2012   SpO2 97%   BMI 32.27 kg/m   Physical Exam Vitals reviewed.  Constitutional:      General: She is not in acute distress.    Appearance: Normal appearance. She is not toxic-appearing.  HENT:     Head: Normocephalic and atraumatic.     Right Ear: External ear normal.     Left Ear: External ear normal.     Nose: Nose normal.  No congestion or rhinorrhea.     Mouth/Throat:     Mouth: Mucous membranes are moist.     Pharynx: Oropharynx is clear. No oropharyngeal exudate or posterior oropharyngeal erythema.  Eyes:     General: No scleral icterus.    Extraocular Movements: Extraocular movements intact.     Conjunctiva/sclera: Conjunctivae normal.     Pupils: Pupils are equal, round, and reactive to light.  Neck:     Comments: Palpable, firm nodule left anterior aspect of neck Cardiovascular:     Rate and Rhythm: Normal rate and regular rhythm.     Pulses: Normal pulses.     Heart sounds: Normal heart sounds. No murmur heard.    No friction rub. No gallop.  Pulmonary:     Effort: Pulmonary effort is normal.     Breath sounds: Normal breath sounds. No wheezing, rhonchi or rales.  Abdominal:     General: Abdomen is flat. Bowel sounds are normal. There is no distension.     Palpations: Abdomen is soft.     Tenderness: There is abdominal tenderness (Dull TTP in RLQ.  No rebound tenderness.).  Musculoskeletal:        General: No swelling. Normal range of motion.     Cervical back: Normal range of motion.     Right lower leg: No edema.     Left lower leg: No edema.  Lymphadenopathy:     Cervical: No cervical adenopathy.  Skin:    General: Skin is warm and dry.     Capillary Refill: Capillary  refill takes less than 2 seconds.     Coloration: Skin is not jaundiced.  Neurological:     General: No focal deficit present.     Mental Status: She is alert and oriented to person, place, and time.  Psychiatric:        Mood and Affect: Mood normal.        Behavior: Behavior normal.     Assessment & Plan:   Problem List Items Addressed This Visit       Hypertension    She is currently prescribed HCTZ 12.5 mg daily for treatment of hypertension.  Her initial blood pressure today was 143/83 and 147/87 on repeat.  She regularly checks her blood pressure at home and reports systolic readings typically 120-130 mmHg. -No medication changes today.  Follow-up in 4 weeks and reassess BP at that time.      Encounter for general adult medical examination with abnormal findings    Presenting today to establish care.  Previous records and labs been reviewed. -Baseline labs ordered today -Influenza vaccine has been declined -Additional HM items are up-to-date -We will tentatively plan for follow-up in 1 month for reassessment      History of cholelithiasis    She endorses a history of cholelithiasis detected on right upper quadrant ultrasound performed in [place] several years ago.  She states that her current symptoms are similar to what she has previously experienced when she was told that she had gallstones. -Repeat right upper quadrant ultrasound ordered today      Right sided abdominal pain    Her acute concern today is chronic right-sided abdominal pain that has been present for many months.  Pain is worse after greasy meals.  She has been taking ibuprofen and drinking tea for symptom relief.  There have been no change in her bowel habits.  She has not experienced any diarrhea, melena, or hematochezia.  She has not appreciated any urinary  changes and denies dysuria, hematuria, and discoloration of urine.  Of note, her past medical history is remarkable for cholelithiasis and she has not  undergone cholecystectomy. -Repeat RUQ U/S ordered today.  If this imaging is unremarkable and her symptoms persist, consider CT abdomen pelvis.      Neck mass    There is a palpable mass in the anterior aspect of her neck.  She endorses a history of thyroid nodules and states that she previously underwent an ultrasound in [place]. -Repeat thyroid ultrasound ordered today.  TFTs ordered today.  She states that she has previous records from [place] and I have asked that she bring these to her follow-up appointment in 4 weeks.      Return in about 4 weeks (around 05/23/2022).   Johnette Abraham, MD

## 2022-04-25 NOTE — Assessment & Plan Note (Signed)
Her acute concern today is chronic right-sided abdominal pain that has been present for many months.  Pain is worse after greasy meals.  She has been taking ibuprofen and drinking tea for symptom relief.  There have been no change in her bowel habits.  She has not experienced any diarrhea, melena, or hematochezia.  She has not appreciated any urinary changes and denies dysuria, hematuria, and discoloration of urine.  Of note, her past medical history is remarkable for cholelithiasis and she has not undergone cholecystectomy. -Repeat RUQ U/S ordered today.  If this imaging is unremarkable and her symptoms persist, consider CT abdomen pelvis.

## 2022-04-25 NOTE — Assessment & Plan Note (Signed)
There is a palpable mass in the anterior aspect of her neck.  She endorses a history of thyroid nodules and states that she previously underwent an ultrasound in [place]. -Repeat thyroid ultrasound ordered today.  TFTs ordered today.  She states that she has previous records from [place] and I have asked that she bring these to her follow-up appointment in 4 weeks.

## 2022-04-25 NOTE — Assessment & Plan Note (Signed)
She endorses a history of cholelithiasis detected on right upper quadrant ultrasound performed in [place] several years ago.  She states that her current symptoms are similar to what she has previously experienced when she was told that she had gallstones. -Repeat right upper quadrant ultrasound ordered today

## 2022-04-26 LAB — CMP14+EGFR
ALT: 25 IU/L (ref 0–32)
AST: 21 IU/L (ref 0–40)
Albumin/Globulin Ratio: 1.8 (ref 1.2–2.2)
Albumin: 4.4 g/dL (ref 3.9–4.9)
Alkaline Phosphatase: 121 IU/L (ref 44–121)
BUN/Creatinine Ratio: 16 (ref 12–28)
BUN: 11 mg/dL (ref 8–27)
Bilirubin Total: 0.5 mg/dL (ref 0.0–1.2)
CO2: 23 mmol/L (ref 20–29)
Calcium: 9.5 mg/dL (ref 8.7–10.3)
Chloride: 102 mmol/L (ref 96–106)
Creatinine, Ser: 0.69 mg/dL (ref 0.57–1.00)
Globulin, Total: 2.4 g/dL (ref 1.5–4.5)
Glucose: 106 mg/dL — ABNORMAL HIGH (ref 70–99)
Potassium: 4.4 mmol/L (ref 3.5–5.2)
Sodium: 141 mmol/L (ref 134–144)
Total Protein: 6.8 g/dL (ref 6.0–8.5)
eGFR: 99 mL/min/{1.73_m2} (ref >59)

## 2022-04-26 LAB — VITAMIN D 25 HYDROXY (VIT D DEFICIENCY, FRACTURES): Vit D, 25-Hydroxy: 26.7 ng/mL — ABNORMAL LOW (ref 30.0–100.0)

## 2022-04-26 LAB — CBC WITH DIFFERENTIAL/PLATELET
Basophils Absolute: 0.1 10*3/uL (ref 0.0–0.2)
Basos: 1 %
EOS (ABSOLUTE): 0.1 10*3/uL (ref 0.0–0.4)
Eos: 2 %
Hematocrit: 38.7 % (ref 34.0–46.6)
Hemoglobin: 13 g/dL (ref 11.1–15.9)
Immature Grans (Abs): 0 10*3/uL (ref 0.0–0.1)
Immature Granulocytes: 0 %
Lymphocytes Absolute: 2.2 10*3/uL (ref 0.7–3.1)
Lymphs: 29 %
MCH: 29.5 pg (ref 26.6–33.0)
MCHC: 33.6 g/dL (ref 31.5–35.7)
MCV: 88 fL (ref 79–97)
Monocytes Absolute: 0.6 10*3/uL (ref 0.1–0.9)
Monocytes: 7 %
Neutrophils Absolute: 4.8 10*3/uL (ref 1.4–7.0)
Neutrophils: 61 %
Platelets: 318 10*3/uL (ref 150–450)
RBC: 4.41 x10E6/uL (ref 3.77–5.28)
RDW: 12.4 % (ref 11.7–15.4)
WBC: 7.7 10*3/uL (ref 3.4–10.8)

## 2022-04-26 LAB — B12 AND FOLATE PANEL
Folate: >20 ng/mL (ref >3.0)
Vitamin B-12: 925 pg/mL (ref 232–1245)

## 2022-04-26 LAB — LIPID PANEL
Chol/HDL Ratio: 3.6 ratio (ref 0.0–4.4)
Cholesterol, Total: 211 mg/dL — ABNORMAL HIGH (ref 100–199)
HDL: 58 mg/dL (ref >39)
LDL Chol Calc (NIH): 135 mg/dL — ABNORMAL HIGH (ref 0–99)
Triglycerides: 103 mg/dL (ref 0–149)
VLDL Cholesterol Cal: 18 mg/dL (ref 5–40)

## 2022-04-26 LAB — TSH+FREE T4
Free T4: 1.08 ng/dL (ref 0.82–1.77)
TSH: 1.16 u[IU]/mL (ref 0.450–4.500)

## 2022-04-26 LAB — HEMOGLOBIN A1C
Est. average glucose Bld gHb Est-mCnc: 120 mg/dL
Hgb A1c MFr Bld: 5.8 % — ABNORMAL HIGH (ref 4.8–5.6)

## 2022-05-09 ENCOUNTER — Ambulatory Visit (HOSPITAL_COMMUNITY): Payer: BC Managed Care – PPO

## 2022-05-21 ENCOUNTER — Ambulatory Visit (HOSPITAL_COMMUNITY): Payer: BC Managed Care – PPO

## 2022-05-27 ENCOUNTER — Ambulatory Visit: Payer: BC Managed Care – PPO | Admitting: Internal Medicine

## 2022-07-24 ENCOUNTER — Other Ambulatory Visit: Payer: Self-pay | Admitting: Adult Health

## 2022-08-03 IMAGING — MG MM DIGITAL SCREENING BILAT W/ TOMO AND CAD
8 series · 8 of 24 positions shown · non-contrast
Comparison: Previous exam(s).

CLINICAL DATA: Screening.

EXAM:
DIGITAL SCREENING BILATERAL MAMMOGRAM WITH TOMOSYNTHESIS AND CAD
TECHNIQUE: Bilateral screening digital craniocaudal and mediolateral oblique
mammograms were obtained. Bilateral screening digital breast
tomosynthesis was performed. The images were evaluated with
computer-aided detection.

[L MLO synth-2D]
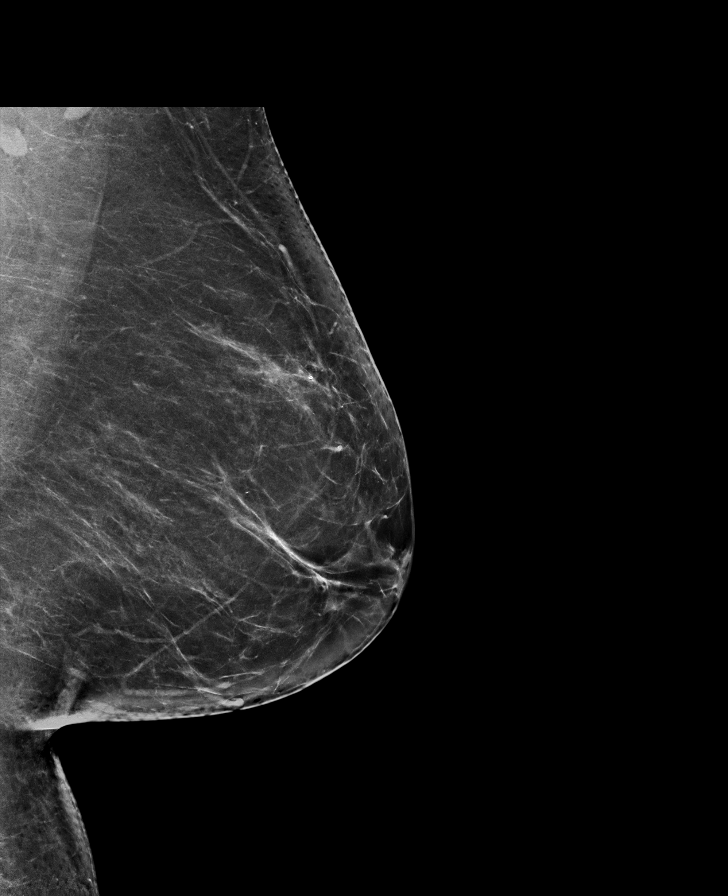

[L CC synth-2D]
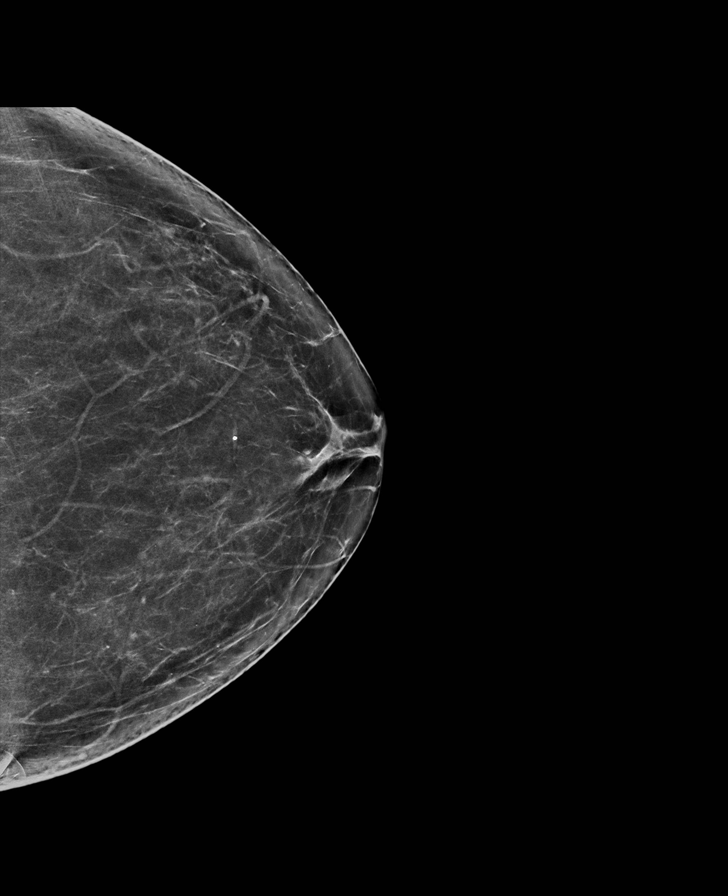

[R MLO synth-2D]
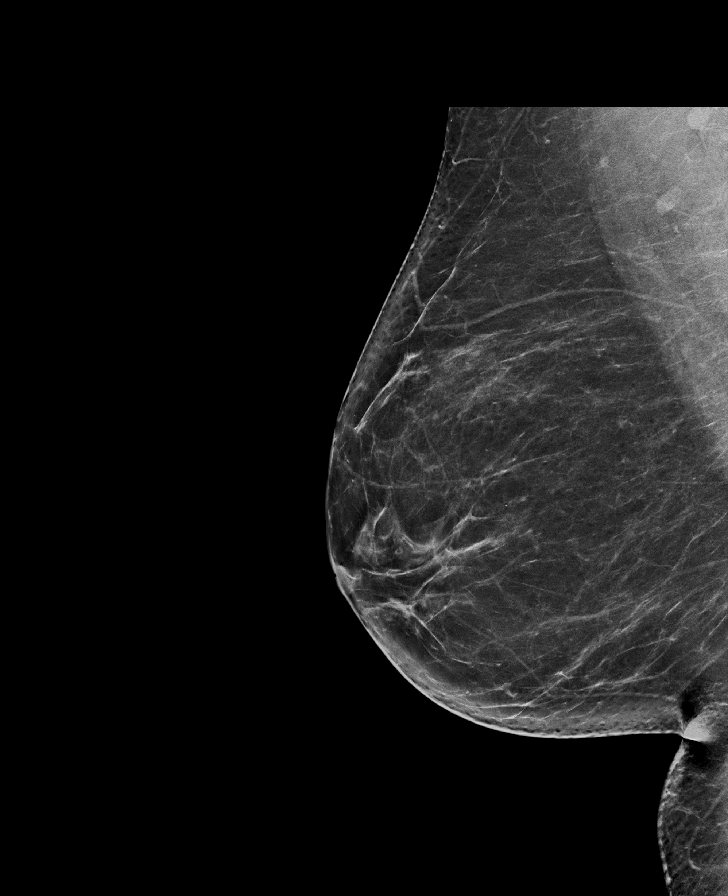

[R CC synth-2D]
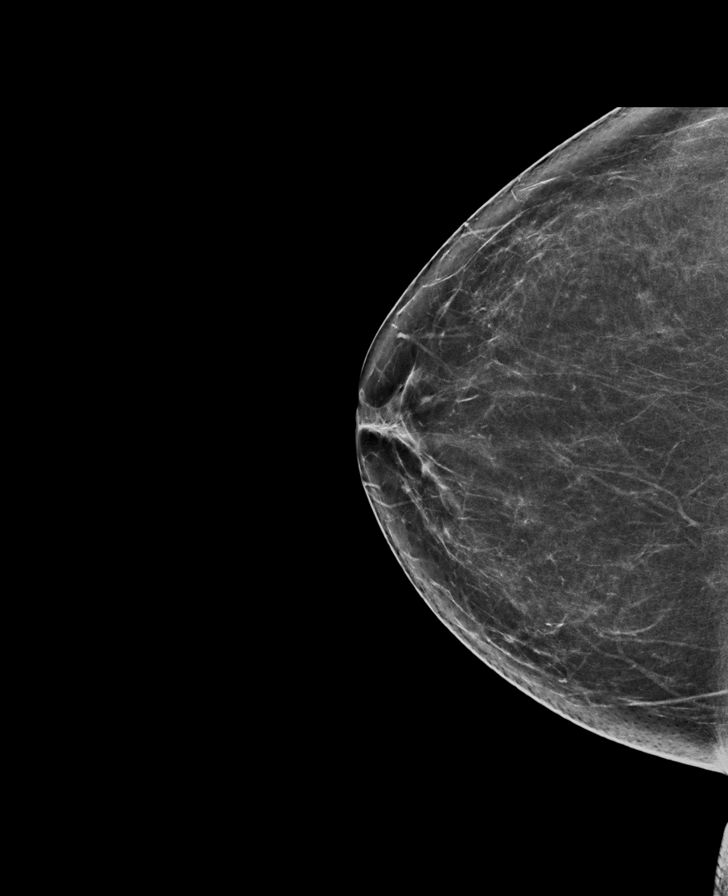

[R CC tomo · tomo slice 37/73.0]
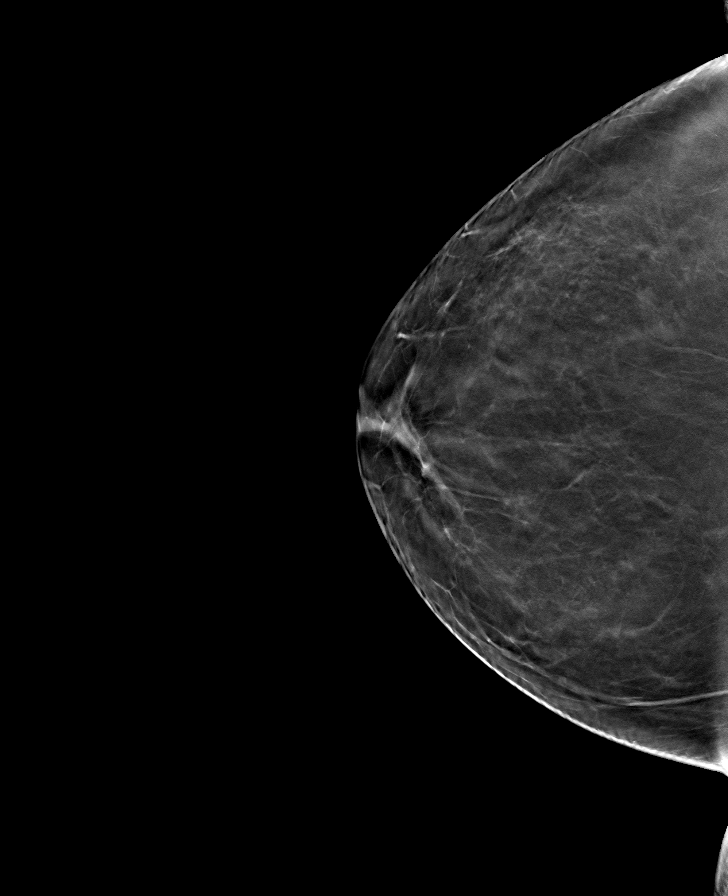

[L CC tomo · tomo slice 37/73.0]
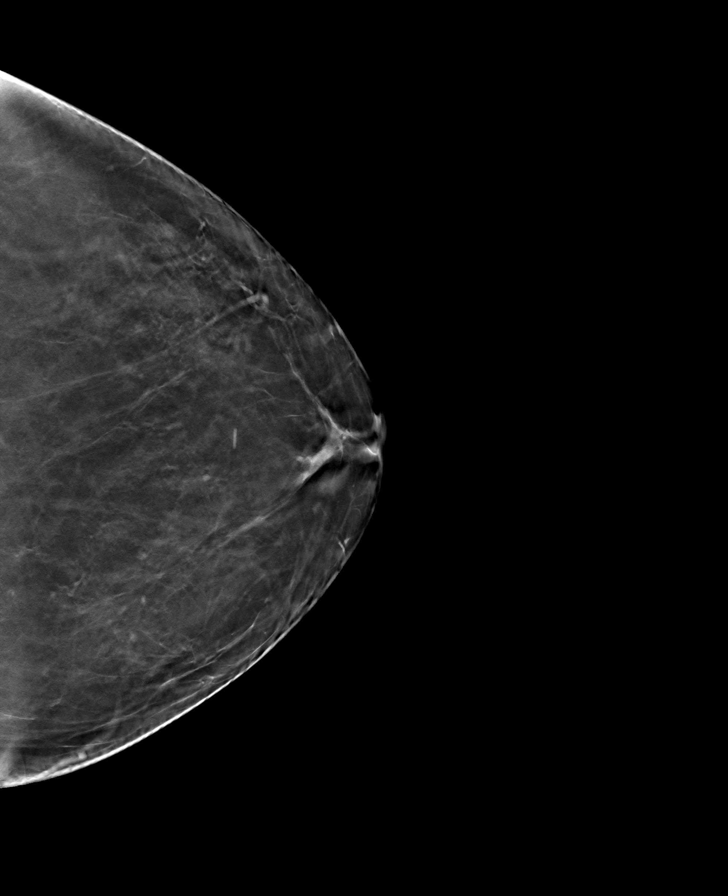

[R MLO tomo · tomo slice 39/78.0]
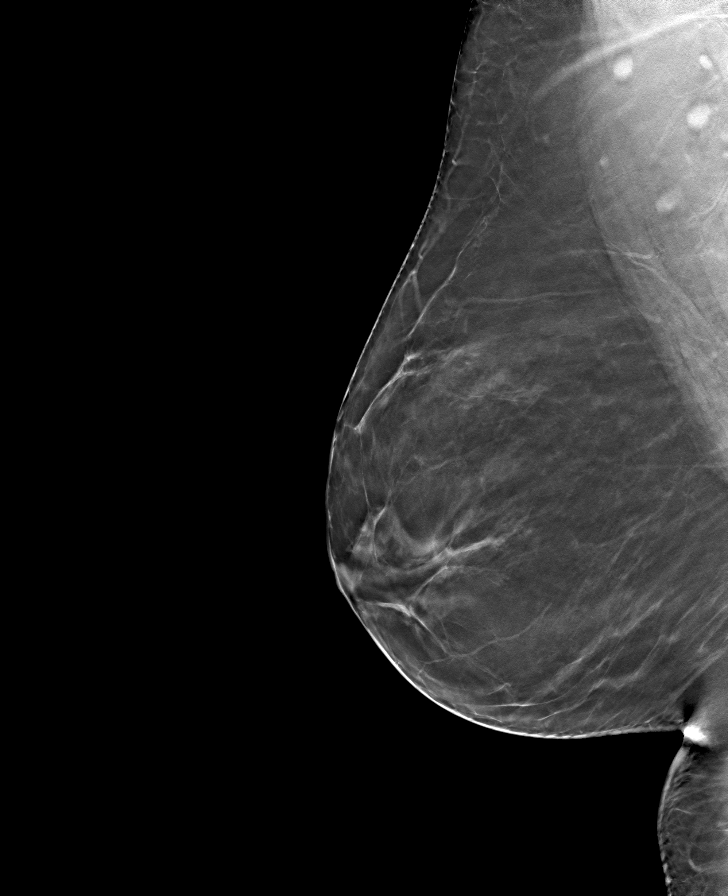

[L MLO tomo · tomo slice 39/78.0]
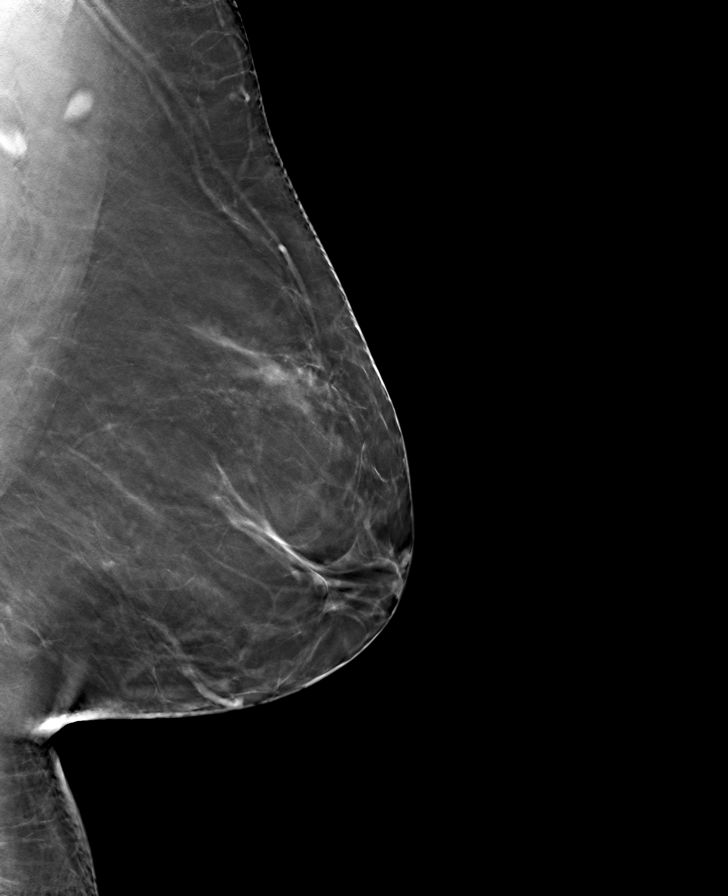

[8 of 24 positions shown; findings below may reference images not displayed]

ACR Breast Density Category b: There are scattered areas of
fibroglandular density.
FINDINGS: There are no findings suspicious for malignancy.
IMPRESSION: No mammographic evidence of malignancy. A result letter of this
screening mammogram will be mailed directly to the patient.

RECOMMENDATION:
Screening mammogram in one year. (Code:51-O-LD2)

BI-RADS CATEGORY  1: Negative.

## 2022-12-04 ENCOUNTER — Other Ambulatory Visit (HOSPITAL_COMMUNITY): Payer: Self-pay | Admitting: Adult Health

## 2022-12-04 DIAGNOSIS — Z1231 Encounter for screening mammogram for malignant neoplasm of breast: Secondary | ICD-10-CM

## 2022-12-12 ENCOUNTER — Encounter (HOSPITAL_COMMUNITY): Payer: Self-pay

## 2022-12-12 ENCOUNTER — Ambulatory Visit (HOSPITAL_COMMUNITY)
Admission: RE | Admit: 2022-12-12 | Discharge: 2022-12-12 | Disposition: A | Discharge status: Home / Self Care | Payer: BC Managed Care – PPO | Source: Ambulatory Visit | Attending: Adult Health | Admitting: Adult Health

## 2022-12-12 DIAGNOSIS — Z1231 Encounter for screening mammogram for malignant neoplasm of breast: Secondary | ICD-10-CM | POA: Insufficient documentation

## 2023-01-22 ENCOUNTER — Other Ambulatory Visit: Payer: Self-pay | Admitting: Adult Health

## 2023-07-27 ENCOUNTER — Other Ambulatory Visit: Payer: Self-pay | Admitting: Adult Health

## 2023-12-24 ENCOUNTER — Other Ambulatory Visit (HOSPITAL_COMMUNITY): Payer: Self-pay | Admitting: Adult Health

## 2023-12-24 DIAGNOSIS — Z1231 Encounter for screening mammogram for malignant neoplasm of breast: Secondary | ICD-10-CM

## 2024-01-14 ENCOUNTER — Ambulatory Visit (HOSPITAL_COMMUNITY)
Admission: RE | Admit: 2024-01-14 | Discharge: 2024-01-14 | Disposition: A | Discharge status: Home / Self Care | Payer: Self-pay | Source: Ambulatory Visit | Attending: Adult Health | Admitting: Adult Health

## 2024-01-14 DIAGNOSIS — Z1231 Encounter for screening mammogram for malignant neoplasm of breast: Secondary | ICD-10-CM | POA: Insufficient documentation

## 2024-01-16 ENCOUNTER — Ambulatory Visit: Payer: Self-pay | Admitting: Adult Health

## 2024-01-22 ENCOUNTER — Other Ambulatory Visit: Payer: Self-pay | Admitting: Adult Health
